# Patient Record
Sex: Male | Born: 1980 | Race: White | Hispanic: No | Marital: Single | Smoking: Current every day smoker
Health system: Southern US, Community
[De-identification: ages and names within clinical notes are randomized; demographics above are authoritative.]

---

## 2014-11-20 ENCOUNTER — Encounter (HOSPITAL_COMMUNITY)
Admission: EM | Discharge status: Against Medical Advice | Payer: Self-pay | Source: Home / Self Care | Attending: Emergency Medicine

## 2014-11-20 ENCOUNTER — Observation Stay (HOSPITAL_COMMUNITY): Payer: Self-pay | Admitting: Certified Registered Nurse Anesthetist

## 2014-11-20 ENCOUNTER — Observation Stay (HOSPITAL_COMMUNITY)
Admission: EM | Admit: 2014-11-20 | Discharge: 2014-11-20 | Discharge status: Against Medical Advice | Payer: Self-pay | Attending: Surgery | Admitting: Surgery

## 2014-11-20 ENCOUNTER — Emergency Department (HOSPITAL_COMMUNITY): Payer: Self-pay

## 2014-11-20 ENCOUNTER — Observation Stay (HOSPITAL_COMMUNITY): Payer: Self-pay

## 2014-11-20 ENCOUNTER — Encounter (HOSPITAL_COMMUNITY): Payer: Self-pay | Admitting: Emergency Medicine

## 2014-11-20 DIAGNOSIS — S62102A Fracture of unspecified carpal bone, left wrist, initial encounter for closed fracture: Secondary | ICD-10-CM | POA: Diagnosis present

## 2014-11-20 DIAGNOSIS — S3210XA Unspecified fracture of sacrum, initial encounter for closed fracture: Secondary | ICD-10-CM | POA: Insufficient documentation

## 2014-11-20 DIAGNOSIS — S329XXA Fracture of unspecified parts of lumbosacral spine and pelvis, initial encounter for closed fracture: Secondary | ICD-10-CM | POA: Insufficient documentation

## 2014-11-20 DIAGNOSIS — W19XXXA Unspecified fall, initial encounter: Secondary | ICD-10-CM

## 2014-11-20 DIAGNOSIS — S5292XA Unspecified fracture of left forearm, initial encounter for closed fracture: Secondary | ICD-10-CM | POA: Insufficient documentation

## 2014-11-20 DIAGNOSIS — T148XXA Other injury of unspecified body region, initial encounter: Secondary | ICD-10-CM

## 2014-11-20 DIAGNOSIS — S52612A Displaced fracture of left ulna styloid process, initial encounter for closed fracture: Principal | ICD-10-CM | POA: Insufficient documentation

## 2014-11-20 DIAGNOSIS — G5602 Carpal tunnel syndrome, left upper limb: Secondary | ICD-10-CM | POA: Insufficient documentation

## 2014-11-20 DIAGNOSIS — S32592A Other specified fracture of left pubis, initial encounter for closed fracture: Secondary | ICD-10-CM | POA: Insufficient documentation

## 2014-11-20 DIAGNOSIS — S3282XA Multiple fractures of pelvis without disruption of pelvic ring, initial encounter for closed fracture: Secondary | ICD-10-CM | POA: Diagnosis present

## 2014-11-20 DIAGNOSIS — F1210 Cannabis abuse, uncomplicated: Secondary | ICD-10-CM | POA: Insufficient documentation

## 2014-11-20 DIAGNOSIS — W14XXXA Fall from tree, initial encounter: Secondary | ICD-10-CM | POA: Insufficient documentation

## 2014-11-20 DIAGNOSIS — IMO0002 Reserved for concepts with insufficient information to code with codable children: Secondary | ICD-10-CM

## 2014-11-20 DIAGNOSIS — S22089A Unspecified fracture of T11-T12 vertebra, initial encounter for closed fracture: Secondary | ICD-10-CM | POA: Insufficient documentation

## 2014-11-20 DIAGNOSIS — S62122A Displaced fracture of lunate [semilunar], left wrist, initial encounter for closed fracture: Secondary | ICD-10-CM | POA: Insufficient documentation

## 2014-11-20 DIAGNOSIS — F17200 Nicotine dependence, unspecified, uncomplicated: Secondary | ICD-10-CM | POA: Insufficient documentation

## 2014-11-20 HISTORY — PX: CARPAL TUNNEL RELEASE: SHX101

## 2014-11-20 HISTORY — PX: ORIF WRIST FRACTURE: SHX2133

## 2014-11-20 LAB — COMPREHENSIVE METABOLIC PANEL
ALT: 26 U/L (ref 17–63)
AST: 50 U/L — AB (ref 15–41)
Albumin: 3.9 g/dL (ref 3.5–5.0)
Alkaline Phosphatase: 59 U/L (ref 38–126)
Anion gap: 11 (ref 5–15)
BILIRUBIN TOTAL: 0.6 mg/dL (ref 0.3–1.2)
BUN: 5 mg/dL — ABNORMAL LOW (ref 6–20)
CHLORIDE: 103 mmol/L (ref 101–111)
CO2: 23 mmol/L (ref 22–32)
Calcium: 8.8 mg/dL — ABNORMAL LOW (ref 8.9–10.3)
Creatinine, Ser: 1.33 mg/dL — ABNORMAL HIGH (ref 0.61–1.24)
GLUCOSE: 195 mg/dL — AB (ref 65–99)
POTASSIUM: 3.6 mmol/L (ref 3.5–5.1)
SODIUM: 137 mmol/L (ref 135–145)
Total Protein: 6.6 g/dL (ref 6.5–8.1)

## 2014-11-20 LAB — CBC
HEMATOCRIT: 48.1 % (ref 39.0–52.0)
Hemoglobin: 16.4 g/dL (ref 13.0–17.0)
MCH: 32.8 pg (ref 26.0–34.0)
MCHC: 34.1 g/dL (ref 30.0–36.0)
MCV: 96.2 fL (ref 78.0–100.0)
PLATELETS: 297 10*3/uL (ref 150–400)
RBC: 5 MIL/uL (ref 4.22–5.81)
RDW: 12.5 % (ref 11.5–15.5)
WBC: 9.4 10*3/uL (ref 4.0–10.5)

## 2014-11-20 LAB — I-STAT CHEM 8, ED
BUN: 6 mg/dL (ref 6–20)
CREATININE: 1.3 mg/dL — AB (ref 0.61–1.24)
Calcium, Ion: 1.18 mmol/L (ref 1.12–1.23)
Chloride: 102 mmol/L (ref 101–111)
GLUCOSE: 196 mg/dL — AB (ref 65–99)
HCT: 53 % — ABNORMAL HIGH (ref 39.0–52.0)
HEMOGLOBIN: 18 g/dL — AB (ref 13.0–17.0)
Potassium: 3.6 mmol/L (ref 3.5–5.1)
SODIUM: 140 mmol/L (ref 135–145)
TCO2: 24 mmol/L (ref 0–100)

## 2014-11-20 LAB — PROTIME-INR
INR: 1.09 (ref 0.00–1.49)
Prothrombin Time: 14.3 seconds (ref 11.6–15.2)

## 2014-11-20 LAB — ABO/RH: ABO/RH(D): O POS

## 2014-11-20 LAB — ETHANOL: Alcohol, Ethyl (B): 22 mg/dL — ABNORMAL HIGH (ref <5)

## 2014-11-20 LAB — TYPE AND SCREEN
ABO/RH(D): O POS
Antibody Screen: NEGATIVE

## 2014-11-20 SURGERY — OPEN REDUCTION INTERNAL FIXATION (ORIF) WRIST FRACTURE
Anesthesia: General | Site: Hand | Laterality: Left

## 2014-11-20 MED ORDER — STERILE WATER FOR INJECTION IJ SOLN
INTRAMUSCULAR | Status: AC
  Filled 2014-11-20: qty 10

## 2014-11-20 MED ORDER — MORPHINE SULFATE 2 MG/ML IJ SOLN
2.0000 mg | INTRAMUSCULAR | Status: DC | PRN
  Administered 2014-11-20: 2 mg via INTRAVENOUS
  Filled 2014-11-20: qty 1

## 2014-11-20 MED ORDER — ONDANSETRON HCL 4 MG PO TABS: 4.0000 mg | ORAL_TABLET | Freq: Four times a day (QID) | ORAL | Status: DC | PRN

## 2014-11-20 MED ORDER — ENOXAPARIN SODIUM 30 MG/0.3ML ~~LOC~~ SOLN: 30.0000 mg | Freq: Two times a day (BID) | SUBCUTANEOUS | Status: DC

## 2014-11-20 MED ORDER — OXYCODONE HCL 5 MG PO TABS
5.0000 mg | ORAL_TABLET | Freq: Once | ORAL | Status: DC | PRN
Start: 2014-11-20 — End: 2014-11-20

## 2014-11-20 MED ORDER — ARTIFICIAL TEARS OP OINT
TOPICAL_OINTMENT | OPHTHALMIC | Status: AC
  Filled 2014-11-20: qty 3.5

## 2014-11-20 MED ORDER — HYDROMORPHONE HCL 1 MG/ML IJ SOLN
1.0000 mg | Freq: Once | INTRAMUSCULAR | Status: AC
  Administered 2014-11-20: 1 mg via INTRAVENOUS
  Filled 2014-11-20: qty 1

## 2014-11-20 MED ORDER — ROCURONIUM BROMIDE 50 MG/5ML IV SOLN
INTRAVENOUS | Status: AC
  Filled 2014-11-20: qty 1

## 2014-11-20 MED ORDER — OXYCODONE HCL 5 MG/5ML PO SOLN: 5.0000 mg | Freq: Once | ORAL | Status: DC | PRN

## 2014-11-20 MED ORDER — METHOCARBAMOL 500 MG PO TABS: 500.0000 mg | ORAL_TABLET | Freq: Four times a day (QID) | ORAL | Status: DC | PRN

## 2014-11-20 MED ORDER — CEFAZOLIN SODIUM-DEXTROSE 2-3 GM-% IV SOLR
INTRAVENOUS | Status: AC
  Filled 2014-11-20: qty 50

## 2014-11-20 MED ORDER — ROCURONIUM BROMIDE 100 MG/10ML IV SOLN
INTRAVENOUS | Status: DC | PRN
  Administered 2014-11-20: 20 mg via INTRAVENOUS
  Administered 2014-11-20: 50 mg via INTRAVENOUS
  Administered 2014-11-20: 10 mg via INTRAVENOUS

## 2014-11-20 MED ORDER — DEXAMETHASONE SODIUM PHOSPHATE 4 MG/ML IJ SOLN
INTRAMUSCULAR | Status: DC | PRN
  Administered 2014-11-20: 4 mg via INTRAVENOUS

## 2014-11-20 MED ORDER — DEXAMETHASONE SODIUM PHOSPHATE 4 MG/ML IJ SOLN
INTRAMUSCULAR | Status: AC
  Filled 2014-11-20: qty 1

## 2014-11-20 MED ORDER — POLYETHYLENE GLYCOL 3350 17 G PO PACK: 17.0000 g | PACK | Freq: Every day | ORAL | Status: DC

## 2014-11-20 MED ORDER — MORPHINE SULFATE 4 MG/ML IJ SOLN
4.0000 mg | Freq: Once | INTRAMUSCULAR | Status: AC
  Administered 2014-11-20: 4 mg via INTRAVENOUS
  Filled 2014-11-20: qty 1

## 2014-11-20 MED ORDER — FENTANYL CITRATE (PF) 100 MCG/2ML IJ SOLN: 25.0000 ug | INTRAMUSCULAR | Status: DC | PRN

## 2014-11-20 MED ORDER — NEOSTIGMINE METHYLSULFATE 10 MG/10ML IV SOLN
INTRAVENOUS | Status: AC
  Filled 2014-11-20: qty 3

## 2014-11-20 MED ORDER — MIDAZOLAM HCL 2 MG/2ML IJ SOLN
INTRAMUSCULAR | Status: AC
  Administered 2014-11-20: 2 mg via INTRAVENOUS
  Filled 2014-11-20: qty 2

## 2014-11-20 MED ORDER — SUCCINYLCHOLINE CHLORIDE 20 MG/ML IJ SOLN
INTRAMUSCULAR | Status: DC | PRN
  Administered 2014-11-20: 100 mg via INTRAVENOUS

## 2014-11-20 MED ORDER — 0.9 % SODIUM CHLORIDE (POUR BTL) OPTIME
TOPICAL | Status: DC | PRN
  Administered 2014-11-20: 1000 mL

## 2014-11-20 MED ORDER — GLYCOPYRROLATE 0.2 MG/ML IJ SOLN
INTRAMUSCULAR | Status: DC | PRN
  Administered 2014-11-20: .6 mg via INTRAVENOUS

## 2014-11-20 MED ORDER — ONDANSETRON HCL 4 MG/2ML IJ SOLN
4.0000 mg | Freq: Four times a day (QID) | INTRAMUSCULAR | Status: DC | PRN
  Administered 2014-11-20: 4 mg via INTRAVENOUS

## 2014-11-20 MED ORDER — IOHEXOL 300 MG/ML  SOLN
100.0000 mL | Freq: Once | INTRAMUSCULAR | Status: AC | PRN
Start: 2014-11-20 — End: 2014-11-20
  Administered 2014-11-20: 100 mL via INTRAVENOUS

## 2014-11-20 MED ORDER — OXYCODONE HCL 5 MG PO TABS: 10.0000 mg | ORAL_TABLET | ORAL | Status: DC | PRN

## 2014-11-20 MED ORDER — MIDAZOLAM HCL 2 MG/2ML IJ SOLN
INTRAMUSCULAR | Status: AC
  Filled 2014-11-20: qty 2

## 2014-11-20 MED ORDER — NEOSTIGMINE METHYLSULFATE 10 MG/10ML IV SOLN
INTRAVENOUS | Status: DC | PRN
  Administered 2014-11-20: 4 mg via INTRAVENOUS

## 2014-11-20 MED ORDER — CEFAZOLIN SODIUM 1-5 GM-% IV SOLN
1.0000 g | Freq: Three times a day (TID) | INTRAVENOUS | Status: DC
  Filled 2014-11-20: qty 50

## 2014-11-20 MED ORDER — GLYCOPYRROLATE 0.2 MG/ML IJ SOLN
INTRAMUSCULAR | Status: AC
  Filled 2014-11-20: qty 3

## 2014-11-20 MED ORDER — MIDAZOLAM HCL 2 MG/2ML IJ SOLN
2.0000 mg | Freq: Once | INTRAMUSCULAR | Status: AC
  Administered 2014-11-20: 2 mg via INTRAVENOUS

## 2014-11-20 MED ORDER — ONDANSETRON HCL 4 MG/2ML IJ SOLN: 4.0000 mg | Freq: Once | INTRAMUSCULAR | Status: DC | PRN

## 2014-11-20 MED ORDER — NEOSTIGMINE METHYLSULFATE 10 MG/10ML IV SOLN
INTRAVENOUS | Status: AC
  Filled 2014-11-20: qty 1

## 2014-11-20 MED ORDER — VITAMIN C 500 MG PO TABS: 1000.0000 mg | ORAL_TABLET | Freq: Every day | ORAL | Status: DC

## 2014-11-20 MED ORDER — OXYCODONE HCL 5 MG PO TABS
5.0000 mg | ORAL_TABLET | ORAL | Status: DC | PRN
  Administered 2014-11-20: 10 mg via ORAL
  Filled 2014-11-20: qty 2

## 2014-11-20 MED ORDER — FENTANYL CITRATE (PF) 250 MCG/5ML IJ SOLN
INTRAMUSCULAR | Status: AC
Start: 2014-11-20 — End: 2014-11-20
  Filled 2014-11-20: qty 5

## 2014-11-20 MED ORDER — DOCUSATE SODIUM 100 MG PO CAPS: 100.0000 mg | ORAL_CAPSULE | Freq: Two times a day (BID) | ORAL | Status: DC

## 2014-11-20 MED ORDER — LACTATED RINGERS IV SOLN
INTRAVENOUS | Status: DC
  Administered 2014-11-20 (×2): via INTRAVENOUS

## 2014-11-20 MED ORDER — LIDOCAINE HCL (CARDIAC) 20 MG/ML IV SOLN
INTRAVENOUS | Status: DC | PRN
  Administered 2014-11-20: 80 mg via INTRAVENOUS

## 2014-11-20 MED ORDER — FENTANYL CITRATE (PF) 100 MCG/2ML IJ SOLN
INTRAMUSCULAR | Status: DC | PRN
  Administered 2014-11-20: 50 ug via INTRAVENOUS

## 2014-11-20 MED ORDER — FENTANYL CITRATE (PF) 100 MCG/2ML IJ SOLN
INTRAMUSCULAR | Status: AC
  Administered 2014-11-20: 100 ug via INTRAVENOUS
  Filled 2014-11-20: qty 2

## 2014-11-20 MED ORDER — PROPOFOL 10 MG/ML IV BOLUS
INTRAVENOUS | Status: DC | PRN
  Administered 2014-11-20: 400 mg via INTRAVENOUS

## 2014-11-20 MED ORDER — ONDANSETRON HCL 4 MG/2ML IJ SOLN
INTRAMUSCULAR | Status: AC
  Filled 2014-11-20: qty 2

## 2014-11-20 MED ORDER — SODIUM CHLORIDE 0.9 % IV SOLN
1000.0000 mL | Freq: Once | INTRAVENOUS | Status: AC
Start: 2014-11-20 — End: 2014-11-20
  Administered 2014-11-20: 1000 mL via INTRAVENOUS

## 2014-11-20 MED ORDER — FENTANYL CITRATE (PF) 100 MCG/2ML IJ SOLN
100.0000 ug | Freq: Once | INTRAMUSCULAR | Status: AC
  Administered 2014-11-20: 100 ug via INTRAVENOUS

## 2014-11-20 MED ORDER — CEFAZOLIN SODIUM-DEXTROSE 2-3 GM-% IV SOLR
INTRAVENOUS | Status: DC | PRN
Start: 2014-11-20 — End: 2014-11-20
  Administered 2014-11-20: 2 g via INTRAVENOUS

## 2014-11-20 MED ORDER — SODIUM CHLORIDE 0.9 % IV SOLN
1000.0000 mL | INTRAVENOUS | Status: DC
  Administered 2014-11-20: 1000 mL via INTRAVENOUS

## 2014-11-20 SURGICAL SUPPLY — 59 items
ANCHOR FT CORKSCREW MICRO 2-0 (Anchor) ×6 IMPLANT
BANDAGE ELASTIC 3 VELCRO ST LF (GAUZE/BANDAGES/DRESSINGS) ×3 IMPLANT
BANDAGE ELASTIC 4 VELCRO ST LF (GAUZE/BANDAGES/DRESSINGS) ×3 IMPLANT
BLADE SURG ROTATE 9660 (MISCELLANEOUS) IMPLANT
BNDG ESMARK 4X9 LF (GAUZE/BANDAGES/DRESSINGS) ×3 IMPLANT
BNDG GAUZE ELAST 4 BULKY (GAUZE/BANDAGES/DRESSINGS) ×3 IMPLANT
CANISTER SUCTION WELLS/JOHNSON (MISCELLANEOUS) ×3 IMPLANT
CORDS BIPOLAR (ELECTRODE) ×6 IMPLANT
COVER SURGICAL LIGHT HANDLE (MISCELLANEOUS) ×3 IMPLANT
CUFF TOURNIQUET SINGLE 18IN (TOURNIQUET CUFF) ×3 IMPLANT
CUFF TOURNIQUET SINGLE 24IN (TOURNIQUET CUFF) IMPLANT
DRAIN TLS ROUND 10FR (DRAIN) IMPLANT
DRAPE OEC MINIVIEW 54X84 (DRAPES) ×3 IMPLANT
DRAPE SURG 17X23 STRL (DRAPES) ×3 IMPLANT
DRSG ADAPTIC 3X8 NADH LF (GAUZE/BANDAGES/DRESSINGS) ×3 IMPLANT
GAUZE SPONGE 4X4 12PLY STRL (GAUZE/BANDAGES/DRESSINGS) ×3 IMPLANT
GAUZE XEROFORM 1X8 LF (GAUZE/BANDAGES/DRESSINGS) IMPLANT
GAUZE XEROFORM 5X9 LF (GAUZE/BANDAGES/DRESSINGS) ×3 IMPLANT
GLOVE BIOGEL M STRL SZ7.5 (GLOVE) ×3 IMPLANT
GLOVE BIOGEL PI IND STRL 6.5 (GLOVE) ×1 IMPLANT
GLOVE BIOGEL PI IND STRL 7.0 (GLOVE) ×1 IMPLANT
GLOVE BIOGEL PI INDICATOR 6.5 (GLOVE) ×2
GLOVE BIOGEL PI INDICATOR 7.0 (GLOVE) ×2
GLOVE SS BIOGEL STRL SZ 8 (GLOVE) ×2 IMPLANT
GLOVE SUPERSENSE BIOGEL SZ 8 (GLOVE) ×4
GOWN STRL REUS W/ TWL LRG LVL3 (GOWN DISPOSABLE) ×1 IMPLANT
GOWN STRL REUS W/ TWL XL LVL3 (GOWN DISPOSABLE) ×2 IMPLANT
GOWN STRL REUS W/TWL LRG LVL3 (GOWN DISPOSABLE) ×2
GOWN STRL REUS W/TWL XL LVL3 (GOWN DISPOSABLE) ×4
IV CATH 14GX2 1/4 (CATHETERS) ×3 IMPLANT
K-WIRE DBL TROCAR .045X4 ×21 IMPLANT
K-WIRE DBL TROCAR .062X4 ×6 IMPLANT
KIT BASIN OR (CUSTOM PROCEDURE TRAY) ×3 IMPLANT
KIT ROOM TURNOVER OR (KITS) ×3 IMPLANT
KWIRE DBL TROCAR .045X4 ×7 IMPLANT
KWIRE DBL TROCAR .062X4 ×2 IMPLANT
LOOP VESSEL MAXI BLUE (MISCELLANEOUS) IMPLANT
MANIFOLD NEPTUNE II (INSTRUMENTS) IMPLANT
NEEDLE 18GX1X1/2 (RX/OR ONLY) (NEEDLE) ×3 IMPLANT
NEEDLE 22X1 1/2 (OR ONLY) (NEEDLE) IMPLANT
NS IRRIG 1000ML POUR BTL (IV SOLUTION) ×3 IMPLANT
PACK ORTHO EXTREMITY (CUSTOM PROCEDURE TRAY) ×3 IMPLANT
PAD ARMBOARD 7.5X6 YLW CONV (MISCELLANEOUS) ×6 IMPLANT
PAD CAST 3X4 CTTN HI CHSV (CAST SUPPLIES) IMPLANT
PAD CAST 4YDX4 CTTN HI CHSV (CAST SUPPLIES) IMPLANT
PADDING CAST COTTON 3X4 STRL (CAST SUPPLIES)
PADDING CAST COTTON 4X4 STRL (CAST SUPPLIES)
SPONGE LAP 4X18 X RAY DECT (DISPOSABLE) IMPLANT
SUT MNCRL AB 4-0 PS2 18 (SUTURE) IMPLANT
SUT PROLENE 3 0 PS 2 (SUTURE) IMPLANT
SUT PROLENE 4 0 PS 2 18 (SUTURE) ×6 IMPLANT
SUT VIC AB 3-0 FS2 27 (SUTURE) ×3 IMPLANT
SYR CONTROL 10ML LL (SYRINGE) IMPLANT
SYSTEM CHEST DRAIN TLS 7FR (DRAIN) ×6 IMPLANT
TOWEL OR 17X24 6PK STRL BLUE (TOWEL DISPOSABLE) ×3 IMPLANT
TOWEL OR 17X26 10 PK STRL BLUE (TOWEL DISPOSABLE) ×3 IMPLANT
TUBE CONNECTING 12'X1/4 (SUCTIONS) ×1
TUBE CONNECTING 12X1/4 (SUCTIONS) ×2 IMPLANT
TUBE EVACUATION TLS (MISCELLANEOUS) IMPLANT

## 2014-11-20 NOTE — Progress Notes (Signed)
   11/20/14 0900  Clinical Encounter Type  Visited With Health care provider  Visit Type Initial;ED;Trauma   Chaplain was paged to the ED for a level 2 trauma at 9:18 AM. Patient sustained injury after a fall from a tree. Medical team was working with patient when chaplain arrived. Patient requested for his wife to be contacted. However, she has called his personal cell phone and now knows he is at the hospital. No further support needs appear needed at this time. Page Merrilyn Puman-Call chaplain if further support needed.  Cranston NeighborStrother, Breonia Kirstein R, Chaplain  9:39 AM

## 2014-11-20 NOTE — Progress Notes (Signed)
Report given to [place], Scientist, clinical (histocompatibility and immunogenetics)CRNA

## 2014-11-20 NOTE — Consult Note (Signed)
Reason for Consult left wrist fracture dislocation Referring Physician: Trauma service  [name] is an 34 y.o. male.  HPI: 34 year old status post fall 20 feet from a tree as he was attempting to cut down the tree. He sustained a perilunate fracture dislocation with acute carpal tunnel syndrome and likely median nerve injury  He also complains of pelvic pain which general orthopedics is attending to-Dr. Percell Miller  He denies abdominal pain chest pain or neck pain at present time. I reviewed his findings and his complaints. He complains that his hand is numb and that his wrist is highly tender due to the fracture dislocation. X-ray and CT scan of been reviewed.  I reviewed his chart at great length. He is been admitted by the trauma service  History reviewed. No pertinent past medical history.  History reviewed. No pertinent past surgical history.  No family history on file.  Social History:  reports that he has been smoking.  He does not have any smokeless tobacco history on file. He reports that he drinks alcohol. He reports that he uses illicit drugs (Marijuana).  Allergies: No Known Allergies  Medications: I have reviewed the patient's current medications.  Results for orders placed or performed during the hospital encounter of 11/20/14 (from the past 48 hour(s))  Comprehensive metabolic panel     Status: Abnormal   Collection Time: 11/20/14  9:20 AM  Result Value Ref Range   Sodium 137 135 - 145 mmol/L   Potassium 3.6 3.5 - 5.1 mmol/L   Chloride 103 101 - 111 mmol/L   CO2 23 22 - 32 mmol/L   Glucose, Bld 195 (H) 65 - 99 mg/dL   BUN 5 (L) 6 - 20 mg/dL   Creatinine, Ser 1.33 (H) 0.61 - 1.24 mg/dL   Calcium 8.8 (L) 8.9 - 10.3 mg/dL   Total Protein 6.6 6.5 - 8.1 g/dL   Albumin 3.9 3.5 - 5.0 g/dL   AST 50 (H) 15 - 41 U/L   ALT 26 17 - 63 U/L   Alkaline Phosphatase 59 38 - 126 U/L   Total Bilirubin 0.6 0.3 - 1.2 mg/dL   GFR calc non Af Amer >60 >60 mL/min   GFR calc Af Amer >60  >60 mL/min    Comment: (NOTE) The eGFR has been calculated using the CKD EPI equation. This calculation has not been validated in all clinical situations. eGFR's persistently <60 mL/min signify possible Chronic Kidney Disease.    Anion gap 11 5 - 15  CBC     Status: None   Collection Time: 11/20/14  9:20 AM  Result Value Ref Range   WBC 9.4 4.0 - 10.5 K/uL   RBC 5.00 4.22 - 5.81 MIL/uL   Hemoglobin 16.4 13.0 - 17.0 g/dL   HCT 48.1 39.0 - 52.0 %   MCV 96.2 78.0 - 100.0 fL   MCH 32.8 26.0 - 34.0 pg   MCHC 34.1 30.0 - 36.0 g/dL   RDW 12.5 11.5 - 15.5 %   Platelets 297 150 - 400 K/uL  Ethanol     Status: Abnormal   Collection Time: 11/20/14  9:20 AM  Result Value Ref Range   Alcohol, Ethyl (B) 22 (H) <5 mg/dL    Comment:        LOWEST DETECTABLE LIMIT FOR SERUM ALCOHOL IS 5 mg/dL FOR MEDICAL PURPOSES ONLY   Protime-INR     Status: None   Collection Time: 11/20/14  9:20 AM  Result Value Ref Range   Prothrombin  Time 14.3 11.6 - 15.2 seconds   INR 1.09 0.00 - 1.49  Type and screen     Status: None   Collection Time: 11/20/14  9:20 AM  Result Value Ref Range   ABO/RH(D) O POS    Antibody Screen NEG    Sample Expiration 11/23/2014   ABO/Rh     Status: None   Collection Time: 11/20/14  9:20 AM  Result Value Ref Range   ABO/RH(D) O POS   I-Stat Chem 8, ED  (not at [facility], [facility])     Status: Abnormal   Collection Time: 11/20/14  9:45 AM  Result Value Ref Range   Sodium 140 135 - 145 mmol/L   Potassium 3.6 3.5 - 5.1 mmol/L   Chloride 102 101 - 111 mmol/L   BUN 6 6 - 20 mg/dL   Creatinine, Ser 1.30 (H) 0.61 - 1.24 mg/dL   Glucose, Bld 196 (H) 65 - 99 mg/dL   Calcium, Ion 1.18 1.12 - 1.23 mmol/L   TCO2 24 0 - 100 mmol/L   Hemoglobin 18.0 (H) 13.0 - 17.0 g/dL   HCT 53.0 (H) 39.0 - 52.0 %    Dg Wrist Complete Left  11/20/2014   ADDENDUM REPORT: 11/20/2014 11:07  ADDENDUM: These results were called by telephone at the time of interpretation on 11/20/2014 at 1059 am to Dr. Veryl Speak , who verbally acknowledged these results.   Electronically Signed   By: Lowella Grip III M.D.   On: 11/20/2014 11:07   11/20/2014   CLINICAL DATA:  Patient fell from tree  EXAM: LEFT WRIST - COMPLETE 3+ VIEW  COMPARISON:  None.  FINDINGS: Frontal, oblique, lateral, and ulnar deviation scaphoid images were obtained. There is a comminuted fracture of the ulnar styloid with mild displacement of the ulnar styloid from the remainder of the ulna. There is widening between the scaphoid and lunate bone indicative of scapholunate disassociation. On the lateral view, the triquetrum and capitate appear posterior with respect to the lunate, a finding concerning for perilunate dislocation. There is a sclerotic area in the distal scaphoid, concerning for an impaction type injury in this area.  There is evidence of an old healed fracture of the distal fifth metacarpal.  IMPRESSION: Findings concerning for perilunate dislocation. There is scapholunate disassociation with findings concerning for impaction type injury in the distal scaphoid. CT could be helpful for further evaluation of carpal alignment as well as further evaluation of the scaphoid bone in particular.  There is a comminuted fracture of the ulnar styloid with mild displacement of the ulnar styloid with respect to the remainder the ulna. There is evidence of an old healed fracture of the fifth metacarpal.  Electronically Signed: By: Lowella Grip III M.D. On: 11/20/2014 10:58   Ct Lumbar Spine Wo Contrast  11/20/2014   CLINICAL DATA:  Status post 16-20 foot fall today while cutting a tree. Low back pain. Initial encounter.  EXAM: CT LUMBAR SPINE WITHOUT CONTRAST LIMITED  TECHNIQUE: Multidetector CT imaging of the lumbar spine was performed without intravenous contrast administration. Multiplanar CT image reconstructions were also generated.  COMPARISON:  CT abdomen and pelvis this same day.  FINDINGS: The patient has a mild anterior, superior endplate  compression fracture of T12 with an associated subtle sclerotic line at the fracture site. Vertebral body height loss is estimated at up to 15%. There is also mild vertebral body height loss anteriorly at T11 without a sclerotic line identified. This could be due to acute or  remote fracture or less likely Schmorl's nodes. There is no bony retropulsion at either T11 or T12. No involvement of posterior elements is identified. Except as noted, vertebral body height is maintained. Left sacral fracture is noted as seen on CT abdomen and pelvis this same day.  Schmorl's nodes are seen in the inferior endplates of M78, M75 and T12.  IMPRESSION: Mild T12 superior endplate compression fracture without bony retropulsion or extension into the posterior elements.  Mild anterior wedging of T11 may be due to acute or subacute fracture or possibly Schmorl's nodes although this is thought less likely. There is no bony retropulsion or extension into the posterior elements.  Left sacral fracture.   Electronically Signed   By: Inge Rise M.D.   On: 11/20/2014 11:09   Ct Abdomen Pelvis W Contrast  11/20/2014   CLINICAL DATA:  Status post 16-20 foot fall cutting a tree today. Left side low back pain. Pelvic and left groin pain. Initial encounter.  EXAM: CT ABDOMEN AND PELVIS WITH CONTRAST  TECHNIQUE: Multidetector CT imaging of the abdomen and pelvis was performed using the standard protocol following bolus administration of intravenous contrast.  CONTRAST:  100 mL OMNIPAQUE IOHEXOL 300 MG/ML  SOLN  COMPARISON:  None.  FINDINGS: Dependent atelectasis is seen in the lung bases. No pneumothorax is identified. There is no pleural or pericardial effusion.  The gallbladder, spleen, adrenal glands, pancreas and kidneys are unremarkable. Fatty infiltration liver along the falciform ligament is noted. The liver is otherwise unremarkable. No lymphadenopathy is identified.  The patient has a nondisplaced left sacral fracture. Also seen is  a nondisplaced fracture of the high left superior pubic ramus. Mildly displaced fracture of the left inferior pubic ramus is noted. Hematoma is seen anterior to the sacrum and in the left side of the pelvis related to the patient's fractures. There is a mild superior endplate compression fracture of T12. Mild anterior wedging of T11 is also noted but may be related to Schmorl's nodes. No other fracture is identified.  IMPRESSION: Acute left sacral and pubic rami fractures.  Mild superior endplate compression fracture of T12. Please see report of dedicated lumbar spine CT scan this same day.  Mild anterior wedging of T11 could be due to fracture or related to Schmorl's nodes. No other acute finding is identified.   Electronically Signed   By: Inge Rise M.D.   On: 11/20/2014 11:01   Ct Wrist Left Wo Contrast  11/20/2014   CLINICAL DATA:  Fall.  Wrist pain.  Initial encounter.  EXAM: CT OF THE LEFT WRIST WITHOUT CONTRAST  TECHNIQUE: Multidetector CT imaging was performed according to the standard protocol. Multiplanar CT image reconstructions were also generated.  COMPARISON:  11/20/2014 wrist radiographs.  FINDINGS: Perilunate dislocation is present. The radiolunate articulation is preserved. There is dorsal dislocation of the scaphoid, capitate and triquetrum bones. Associated fractures of the dorsal lip of the radius. Comminuted minimally displaced ulnar styloid fracture is also present. Scaphoid waist appears intact. Carpometacarpal joints are normal.  IMPRESSION: Perilunate dislocation. Comminuted ulnar styloid fracture and small fracture fragments from the dorsal lip of the radius. Significantly, the scaphoid bone appears intact.   Electronically Signed   By: Dereck Ligas M.D.   On: 11/20/2014 12:50   Dg Pelvis Portable  11/20/2014   CLINICAL DATA:  Pain following fall from tree  EXAM: PORTABLE PELVIS 1-2 VIEWS  COMPARISON:  None.  FINDINGS: There are small calcifications inferior to the medial left  ischium. Suspect small  avulsions in this area. There is a subtle lucency in the medial aspect of the left femoral neck. A subtle fracture in this area cannot be excluded. No other findings suggesting potential fracture. No dislocation. Joint spaces appear intact. There is a bone island in the left femoral neck region peer  IMPRESSION: Suspect small avulsions arising from the medial inferior left ischium. There is a subtle lucency in the medial aspect of the left femoral neck. Dedicated left hip images are advised to further evaluate this finding. Based on this single image, a nondisplaced fracture in this area cannot be excluded.  Study otherwise unremarkable except for a small bone island in the right femoral neck.   Electronically Signed   By: Lowella Grip III M.D.   On: 11/20/2014 09:58   Dg Pelvis Comp Min 3v  11/20/2014   CLINICAL DATA:  Followup pelvic fractures noted on the current CT. Left-sided pelvic pain. Patient fell from a tree today.  EXAM: JUDET PELVIS - 3+ VIEW  COMPARISON:  Current abdomen and pelvis CT.  FINDINGS: There are fractures of the inferior left ischium near the ischial pelvic junction, and of the superior medial left pubis, without significant displacement or comminution. The left sacral fracture is not well defined radiographically.  No other fractures. Hip joints, SI joints and symphysis pubis are normally spaced and aligned.  Residual contrast is noted in an intact bladder.  IMPRESSION: 1. Nondisplaced fractures of the left pubis in the inferior left ischium as noted on the current CT. Left sacral fracture not defined radiographically. 2. No other fractures.  No dislocation.  Intact bladder.   Electronically Signed   By: Lajean Manes M.D.   On: 11/20/2014 14:43   Dg Chest Portable 1 View  11/20/2014   CLINICAL DATA:  Status post a fall from tree; patient reports shoulder pain.  EXAM: PORTABLE CHEST - 1 VIEW  COMPARISON:  None.  FINDINGS: The lungs are adequately inflated.  There is no pneumothorax or pneumomediastinum. The heart and pulmonary vascularity are normal. The mediastinum is normal in width. The observed portions of the bony thorax are unremarkable. The shoulders are not completely included in the field of view.  IMPRESSION: No acute post traumatic injury of the thorax is observed. There is no acute cardiopulmonary disease.   Electronically Signed   By: David  Martinique M.D.   On: 11/20/2014 09:52   Dg Shoulder Left  11/20/2014   CLINICAL DATA:  Status post 16-20 foot fall from a tree today. Left shoulder pain and stiffness. Initial encounter.  EXAM: LEFT SHOULDER - 2+ VIEW  COMPARISON:  None.  FINDINGS: There is no evidence of fracture or dislocation. There is no evidence of arthropathy or other focal bone abnormality. Soft tissues are unremarkable.  IMPRESSION: Negative exam.   Electronically Signed   By: Inge Rise M.D.   On: 11/20/2014 11:10    Review of Systems  Respiratory: Negative.   Genitourinary: Negative.    Blood pressure 141/86, pulse 69, temperature 98.2 F (36.8 C), temperature source Oral, resp. rate 18, height '6\' 3"'  (1.905 m), weight 104.327 kg (230 lb), SpO2 100 %. Physical Exam  male , alert and oriented Left wrist has obvious deformity swelling and numbness in the carpal tunnel distribution. This is a closed injury.  Bilateral elbow examination and shoulder examination is benign is no evidence of compartment syndrome. There is no evidence of instability about the elbow or shoulder. He has intact pulse. He has intact refill to the  fingers bilaterally. His right upper extremity has IV access.  His x-rays of left wrist do show perilunate fracture dislocation.  His pelvis is tender he has fractures attended to by general orthopedics present at present time lower extremity exam is intact to sensation and motor function. Abdomen is not particularly tender his chest is clear.  He is alert and oriented and very  appropriate.  Assessment/Plan: Left wrist perilunate fracture dislocation with associated carpal tunnel syndrome.  I would recommend immediate surgical repair reconstruction with carpal tunnel release. I discussed with him risk of infection bleeding anesthesia stiffness and long-term outlook with this injury which is rather severe in regards to the ultimate disability imparted  We'll proceed accordingly assess operative facility is available  The patient is alert and oriented in no acute distress. The patient complains of pain in the affected upper extremity.  The patient is noted to have a normal HEENT exam. Lung fields show equal chest expansion and no shortness of breath. Abdomen exam is nontender without distention. Lower extremity examination does not show any fracture dislocation or blood clot symptoms. Pelvis is stable and the neck and back are stable and nontender.  Paulene Floor 11/20/2014, 5:07 PM

## 2014-11-20 NOTE — ED Notes (Signed)
Attempted report 

## 2014-11-20 NOTE — ED Notes (Signed)
Notified OR of pts room assignment

## 2014-11-20 NOTE — Anesthesia Procedure Notes (Addendum)
Anesthesia Regional Block:  Supraclavicular block  Pre-Anesthetic Checklist: ,, timeout performed, Correct Patient, Correct Site, Correct Laterality, Correct Procedure, Correct Position, site marked, Risks and benefits discussed,  Surgical consent,  Pre-op evaluation,  At surgeon's request and post-op pain management  Laterality: Left  Prep: chloraprep       Needles:  Injection technique: Single-shot     Needle Length: 9cm 9 cm Needle Gauge: 22 and 22 G    Additional Needles:  Procedures: ultrasound guided (picture in chart) Supraclavicular block Narrative:  Start time: 11/20/2014 5:45 PM End time: 11/20/2014 5:55 PM Injection made incrementally with aspirations every 5 mL.  Performed by: Personally   Additional Notes: 30 cc 0.5% Bupivacaine with 1:200 Epi injected easily   Procedure Name: Intubation Date/Time: 11/20/2014 6:39 PM Performed by: Merdis Delay Pre-anesthesia Checklist: Patient identified, Emergency Drugs available, Suction available, Patient being monitored and Timeout performed Patient Re-evaluated:Patient Re-evaluated prior to inductionOxygen Delivery Method: Circle system utilized Preoxygenation: Pre-oxygenation with 100% oxygen Intubation Type: IV induction Laryngoscope Size: Mac and 4 Grade View: Grade II Tube type: Oral Tube size: 7.5 mm Number of attempts: 1 Airway Equipment and Method: Stylet and LTA kit utilized Placement Confirmation: ETT inserted through vocal cords under direct vision,  positive ETCO2,  CO2 detector and breath sounds checked- equal and bilateral Secured at: 24 cm Tube secured with: Tape Dental Injury: Teeth and Oropharynx as per pre-operative assessment

## 2014-11-20 NOTE — Anesthesia Postprocedure Evaluation (Signed)
  Anesthesia Post-op Note  Patient: [name] Ihnen  Procedure(s) Performed: Procedure(s): OPEN REDUCTION INTERNAL FIXATION (ORIF) LEFT WRIST RECONSTRUCTION  (Left) CARPAL TUNNEL RELEASE (Left)  Patient Location: PACU  Anesthesia Type: General, regional   Level of Consciousness: awake, alert  and oriented  Airway and Oxygen Therapy: Patient Spontanous Breathing  Post-op Pain: none  Post-op Assessment: Post-op Vital signs reviewed  Post-op Vital Signs: Reviewed  Last Vitals:  Filed Vitals:   11/20/14 2100  BP: 146/92  Pulse: 59  Temp: 36.5 C  Resp: 17    Complications: No apparent anesthesia complications

## 2014-11-20 NOTE — Progress Notes (Addendum)
Patient left the floor to OR

## 2014-11-20 NOTE — Anesthesia Preprocedure Evaluation (Addendum)
Anesthesia Evaluation  Patient identified by MRN, date of birth, ID band Patient awake    Reviewed: Allergy & Precautions, NPO status , Patient's Chart, lab work & pertinent test results  Airway Mallampati: II  TM Distance: >3 FB Neck ROM: Full    Dental  (+) Poor Dentition,    Pulmonary pneumonia -, Current Smoker,  breath sounds clear to auscultation        Cardiovascular Rhythm:Regular Rate:Normal     Neuro/Psych    GI/Hepatic   Endo/Other    Renal/GU      Musculoskeletal   Abdominal   Peds  Hematology   Anesthesia Other Findings   Reproductive/Obstetrics                           Anesthesia Physical Anesthesia Plan  ASA: II  Anesthesia Plan: General   Post-op Pain Management:    Induction: Intravenous  Airway Management Planned: Oral ETT  Additional Equipment:   Intra-op Plan:   Post-operative Plan: Extubation in OR  Informed Consent: I have reviewed the patients History and Physical, chart, labs and discussed the procedure including the risks, benefits and alternatives for the proposed anesthesia with the patient or authorized representative who has indicated his/her understanding and acceptance.   Dental advisory given  Plan Discussed with: CRNA and Anesthesiologist  Anesthesia Plan Comments:         Anesthesia Quick Evaluation

## 2014-11-20 NOTE — ED Notes (Signed)
Pt reports that he was working on a cutting a tree and fell approximately 16-20 feet. Pt reports left groin pain and left wrist pain. Pt alert x4.

## 2014-11-20 NOTE — ED Notes (Signed)
This RN came back in room and wife had sat pt up in the stretcher and informed them both that he had not been cleared from the CT scans and the doctor. Pt lowered back down. This RN asked prior who sat him up and the wife responded that she did.

## 2014-11-20 NOTE — Transfer of Care (Signed)
Immediate Anesthesia Transfer of Care Note  Patient: Kyle SalisburyWade Wedge  Procedure(s) Performed: Procedure(s): OPEN REDUCTION INTERNAL FIXATION (ORIF) LEFT WRIST RECONSTRUCTION  (Left) CARPAL TUNNEL RELEASE (Left)  Patient Location: PACU  Anesthesia Type:General  Level of Consciousness: awake, alert  and patient cooperative  Airway & Oxygen Therapy: Patient Spontanous Breathing and Patient connected to nasal cannula oxygen  Post-op Assessment: Report given to RN and Post -op Vital signs reviewed and stable  Post vital signs: Reviewed and stable  Last Vitals:  Filed Vitals:   11/20/14 2042  BP:   Pulse: 71  Temp: 36.8 C  Resp: 11    Complications: No apparent anesthesia complications

## 2014-11-20 NOTE — ED Notes (Signed)
No neuro arrival.

## 2014-11-20 NOTE — Consult Note (Signed)
ORTHOPAEDIC CONSULTATION  REQUESTING PHYSICIAN: Veryl Speak, MD  Chief Complaint: fall from a tree  HPI: [name] is a 34 y.o. male who complains of  he was hoping to cut down a tree and was anchored roughly 20 feet up in the air when the top of the tree broke its anchor line and pulled him down. He landed on his left legs then fell to his left side. He complains of pain in his left wrist as well as pain in his left groin no other complaints. He reports numbness and tingling in his left hand otherwise he denies any changes in sensation.  History reviewed. No pertinent past medical history. History reviewed. No pertinent past surgical history. History   Social History  . Marital Status: Single    Spouse Name: N/A  . Number of Children: N/A  . Years of Education: N/A   Social History Main Topics  . Smoking status: Current Every Day Smoker  . Smokeless tobacco: Not on file  . Alcohol Use: Yes  . Drug Use: Yes    Special: Marijuana  . Sexual Activity: Not on file   Other Topics Concern  . None   Social History Narrative  . None   No family history on file. No Known Allergies Prior to Admission medications   Medication Sig Start Date End Date Taking? Authorizing Provider  multivitamin (ONE-A-DAY MEN'S) TABS tablet Take 1 tablet by mouth daily.   Yes Historical Provider, MD  Omega-3 Fatty Acids (FISH OIL PO) Take 1 capsule by mouth daily.   Yes Historical Provider, MD   Dg Wrist Complete Left  11/20/2014   ADDENDUM REPORT: 11/20/2014 11:07  ADDENDUM: These results were called by telephone at the time of interpretation on 11/20/2014 at 1059 am to Dr. Veryl Speak , who verbally acknowledged these results.   Electronically Signed   By: Lowella Grip III M.D.   On: 11/20/2014 11:07   11/20/2014   CLINICAL DATA:  Patient fell from tree  EXAM: LEFT WRIST - COMPLETE 3+ VIEW  COMPARISON:  None.  FINDINGS: Frontal, oblique, lateral, and ulnar deviation scaphoid images were  obtained. There is a comminuted fracture of the ulnar styloid with mild displacement of the ulnar styloid from the remainder of the ulna. There is widening between the scaphoid and lunate bone indicative of scapholunate disassociation. On the lateral view, the triquetrum and capitate appear posterior with respect to the lunate, a finding concerning for perilunate dislocation. There is a sclerotic area in the distal scaphoid, concerning for an impaction type injury in this area.  There is evidence of an old healed fracture of the distal fifth metacarpal.  IMPRESSION: Findings concerning for perilunate dislocation. There is scapholunate disassociation with findings concerning for impaction type injury in the distal scaphoid. CT could be helpful for further evaluation of carpal alignment as well as further evaluation of the scaphoid bone in particular.  There is a comminuted fracture of the ulnar styloid with mild displacement of the ulnar styloid with respect to the remainder the ulna. There is evidence of an old healed fracture of the fifth metacarpal.  Electronically Signed: By: Lowella Grip III M.D. On: 11/20/2014 10:58   Ct Lumbar Spine Wo Contrast  11/20/2014   CLINICAL DATA:  Status post 16-20 foot fall today while cutting a tree. Low back pain. Initial encounter.  EXAM: CT LUMBAR SPINE WITHOUT CONTRAST LIMITED  TECHNIQUE: Multidetector CT imaging of the lumbar spine was performed without intravenous contrast  administration. Multiplanar CT image reconstructions were also generated.  COMPARISON:  CT abdomen and pelvis this same day.  FINDINGS: The patient has a mild anterior, superior endplate compression fracture of T12 with an associated subtle sclerotic line at the fracture site. Vertebral body height loss is estimated at up to 15%. There is also mild vertebral body height loss anteriorly at T11 without a sclerotic line identified. This could be due to acute or remote fracture or less likely Schmorl's  nodes. There is no bony retropulsion at either T11 or T12. No involvement of posterior elements is identified. Except as noted, vertebral body height is maintained. Left sacral fracture is noted as seen on CT abdomen and pelvis this same day.  Schmorl's nodes are seen in the inferior endplates of T01, S01 and T12.  IMPRESSION: Mild T12 superior endplate compression fracture without bony retropulsion or extension into the posterior elements.  Mild anterior wedging of T11 may be due to acute or subacute fracture or possibly Schmorl's nodes although this is thought less likely. There is no bony retropulsion or extension into the posterior elements.  Left sacral fracture.   Electronically Signed   By: Inge Rise M.D.   On: 11/20/2014 11:09   Ct Abdomen Pelvis W Contrast  11/20/2014   CLINICAL DATA:  Status post 16-20 foot fall cutting a tree today. Left side low back pain. Pelvic and left groin pain. Initial encounter.  EXAM: CT ABDOMEN AND PELVIS WITH CONTRAST  TECHNIQUE: Multidetector CT imaging of the abdomen and pelvis was performed using the standard protocol following bolus administration of intravenous contrast.  CONTRAST:  100 mL OMNIPAQUE IOHEXOL 300 MG/ML  SOLN  COMPARISON:  None.  FINDINGS: Dependent atelectasis is seen in the lung bases. No pneumothorax is identified. There is no pleural or pericardial effusion.  The gallbladder, spleen, adrenal glands, pancreas and kidneys are unremarkable. Fatty infiltration liver along the falciform ligament is noted. The liver is otherwise unremarkable. No lymphadenopathy is identified.  The patient has a nondisplaced left sacral fracture. Also seen is a nondisplaced fracture of the high left superior pubic ramus. Mildly displaced fracture of the left inferior pubic ramus is noted. Hematoma is seen anterior to the sacrum and in the left side of the pelvis related to the patient's fractures. There is a mild superior endplate compression fracture of T12. Mild  anterior wedging of T11 is also noted but may be related to Schmorl's nodes. No other fracture is identified.  IMPRESSION: Acute left sacral and pubic rami fractures.  Mild superior endplate compression fracture of T12. Please see report of dedicated lumbar spine CT scan this same day.  Mild anterior wedging of T11 could be due to fracture or related to Schmorl's nodes. No other acute finding is identified.   Electronically Signed   By: Inge Rise M.D.   On: 11/20/2014 11:01   Ct Wrist Left Wo Contrast  11/20/2014   CLINICAL DATA:  Fall.  Wrist pain.  Initial encounter.  EXAM: CT OF THE LEFT WRIST WITHOUT CONTRAST  TECHNIQUE: Multidetector CT imaging was performed according to the standard protocol. Multiplanar CT image reconstructions were also generated.  COMPARISON:  11/20/2014 wrist radiographs.  FINDINGS: Perilunate dislocation is present. The radiolunate articulation is preserved. There is dorsal dislocation of the scaphoid, capitate and triquetrum bones. Associated fractures of the dorsal lip of the radius. Comminuted minimally displaced ulnar styloid fracture is also present. Scaphoid waist appears intact. Carpometacarpal joints are normal.  IMPRESSION: Perilunate dislocation. Comminuted ulnar styloid  fracture and small fracture fragments from the dorsal lip of the radius. Significantly, the scaphoid bone appears intact.   Electronically Signed   By: Dereck Ligas M.D.   On: 11/20/2014 12:50   Dg Pelvis Portable  11/20/2014   CLINICAL DATA:  Pain following fall from tree  EXAM: PORTABLE PELVIS 1-2 VIEWS  COMPARISON:  None.  FINDINGS: There are small calcifications inferior to the medial left ischium. Suspect small avulsions in this area. There is a subtle lucency in the medial aspect of the left femoral neck. A subtle fracture in this area cannot be excluded. No other findings suggesting potential fracture. No dislocation. Joint spaces appear intact. There is a bone island in the left femoral  neck region peer  IMPRESSION: Suspect small avulsions arising from the medial inferior left ischium. There is a subtle lucency in the medial aspect of the left femoral neck. Dedicated left hip images are advised to further evaluate this finding. Based on this single image, a nondisplaced fracture in this area cannot be excluded.  Study otherwise unremarkable except for a small bone island in the right femoral neck.   Electronically Signed   By: Lowella Grip III M.D.   On: 11/20/2014 09:58   Dg Chest Portable 1 View  11/20/2014   CLINICAL DATA:  Status post a fall from tree; patient reports shoulder pain.  EXAM: PORTABLE CHEST - 1 VIEW  COMPARISON:  None.  FINDINGS: The lungs are adequately inflated. There is no pneumothorax or pneumomediastinum. The heart and pulmonary vascularity are normal. The mediastinum is normal in width. The observed portions of the bony thorax are unremarkable. The shoulders are not completely included in the field of view.  IMPRESSION: No acute post traumatic injury of the thorax is observed. There is no acute cardiopulmonary disease.   Electronically Signed   By: David  Martinique M.D.   On: 11/20/2014 09:52   Dg Shoulder Left  11/20/2014   CLINICAL DATA:  Status post 16-20 foot fall from a tree today. Left shoulder pain and stiffness. Initial encounter.  EXAM: LEFT SHOULDER - 2+ VIEW  COMPARISON:  None.  FINDINGS: There is no evidence of fracture or dislocation. There is no evidence of arthropathy or other focal bone abnormality. Soft tissues are unremarkable.  IMPRESSION: Negative exam.   Electronically Signed   By: Inge Rise M.D.   On: 11/20/2014 11:10    Positive ROS: All other systems have been reviewed and were otherwise negative with the exception of those mentioned in the HPI and as above.  Labs cbc  Recent Labs  11/20/14 0920 11/20/14 0945  WBC 9.4  --   HGB 16.4 18.0*  HCT 48.1 53.0*  PLT 297  --     Labs inflam No results for input(s): CRP in the  last 72 hours.  Invalid input(s): ESR  Labs coag  Recent Labs  11/20/14 0920  INR 1.09     Recent Labs  11/20/14 0920 11/20/14 0945  NA 137 140  K 3.6 3.6  CL 103 102  CO2 23  --   GLUCOSE 195* 196*  BUN 5* 6  CREATININE 1.33* 1.30*  CALCIUM 8.8*  --     Physical Exam: Filed Vitals:   11/20/14 1245  BP: 139/52  Pulse: 73  Temp:   Resp: 19   General: Alert, no acute distress Cardiovascular: No pedal edema Respiratory: No cyanosis, no use of accessory musculature GI: No organomegaly, abdomen is soft and non-tender Skin: No lesions in  the area of chief complaint other than those listed below in MSK exam.  Neurologic: Sensation intact distally Psychiatric: Patient is competent for consent with normal mood and affect Lymphatic: No axillary or cervical lymphadenopathy  MUSCULOSKELETAL:  At his left upper extremity he has significant swelling in his wrist with decreasing sensation in all nerve distributions. Compartments are soft  At his left lower extremity his compartments are soft he has no tenderness at his knee ankle or feet painless knee and ankle range of motion he can fire his toe flexors and extensors and his ankle as well. He has some tight pain in his left groin with logroll of his hip.  Other extremities are atraumatic with painless ROM and NVI.  Assessment: Left pelvic ring fracture, Left sacrum fracture Left perilunate dislocation  Plan: Obtain inlet/outlet pelvis, likely non-op with [facility] for 6-8wks Hand per dr. Amedeo Plenty Neurosurgery to eval spine.   Renette Butters, MD Cell [phone]   11/20/2014 12:53 PM

## 2014-11-20 NOTE — ED Notes (Signed)
Rn into pt room, pt was sitting up in bed again.  Asked pt why he was sitting up and wife stated "he doesn't want to lay flat anymore and the doctor told him he could sit up".  RN spoke with MD, MD does NOT want pt to be sitting up until seen by surgeon.  Pt lowered down and educated.

## 2014-11-20 NOTE — ED Provider Notes (Signed)
CSN: [id number]     Arrival date & time 11/20/14  0909 History   First MD Initiated Contact with Patient 11/20/14 [phone]     Chief Complaint  Patient presents with  . Fall     (Consider location/radiation/quality/duration/timing/severity/associated sxs/prior Treatment) HPI Comments: Patient is a 34 year old male with no significant past medical history. He presents for evaluation of a fall. Patient was apparently trimming a tree when he fell approximately 20 feet. He states he landed on his left side. He is complaining of pain in his left hip, left wrist, and left shoulder. He denies any chest pain or difficulty breathing. He denies any abdominal pain. He denies any loss of consciousness or headache.  Patient is a 34 y.o. male presenting with fall. The history is provided by the patient.  Fall This is a new problem. The current episode started less than 1 hour ago. The problem occurs constantly. The problem has not changed since onset.Pertinent negatives include no chest pain, no abdominal pain, no headaches and no shortness of breath. Exacerbated by: Movement, bearing weight, and palpation. Nothing relieves the symptoms. He has tried nothing for the symptoms. The treatment provided no relief.    History reviewed. No pertinent past medical history. History reviewed. No pertinent past surgical history. No family history on file. History  Substance Use Topics  . Smoking status: Current Every Day Smoker  . Smokeless tobacco: Not on file  . Alcohol Use: Yes    Review of Systems  Respiratory: Negative for shortness of breath.   Cardiovascular: Negative for chest pain.  Gastrointestinal: Negative for abdominal pain.  Neurological: Negative for headaches.  All other systems reviewed and are negative.     Allergies  Review of patient's allergies indicates no known allergies.  Home Medications   Prior to Admission medications   Not on File   BP 130/70 mmHg  Pulse 58  Temp(Src) 98  F (36.7 C) (Oral)  Resp 24  Ht  (1.905 m)  Wt 230 lb (104.327 kg)  BMI 28.75 kg/m2  SpO2 100% Physical Exam  Constitutional: He is oriented to person, place, and time. He appears well-developed and well-nourished. No distress.  HENT:  Head: Normocephalic and atraumatic.  Neck: Normal range of motion. Neck supple.  Cardiovascular: Normal rate, regular rhythm and normal heart sounds.   No murmur heard. Pulmonary/Chest: Effort normal and breath sounds normal. No respiratory distress. He has no wheezes.  Abdominal: Soft. Bowel sounds are normal. He exhibits no distension. There is no tenderness.  Musculoskeletal: Normal range of motion. He exhibits no edema.  There is severe tenderness over the left anterior iliac bone. Pelvis seems stable to rock. DP and PT pulses are palpable in both extremities. He is able to move his feet and toes. Sensation is intact to the entire foot.  Neurological: He is alert and oriented to person, place, and time.  Skin: Skin is warm and dry. He is not diaphoretic.  Nursing note and vitals reviewed.   ED Course  Procedures (including critical care time) Labs Review Labs Reviewed  CDS SEROLOGY  COMPREHENSIVE METABOLIC PANEL  CBC  ETHANOL  PROTIME-INR  I-STAT CHEM 8, ED  TYPE AND SCREEN    Imaging Review No results found.   EKG Interpretation None      MDM   Final diagnoses:  Fall    Patient presents here after a fall from a tree complaining of pain in the left pelvis and left wrist. Imaging studies reveal fractures of  the superior and inferior ramus and a perilunate fracture of the left wrist. He was also found to have a T12 compression fracture with no retropulsion or canal intrusion.  I've discussed the pelvic fractures with Dr. Eulah PontMurphy from orthopedics. He will evaluate the patient and placed recommendations.  I've also discussed the perilunate dislocation with Dr. [name]. He plans to take the patient to the operating room for  surgical repair of this.  At the request of Dr. Eulah PontMurphy, I have notified neurosurgery of the T12 compression fracture. Dr.Nundkumar recommends no surgical intervention. He does recommend a BSO brace.  Trauma surgery has also been consult it and will evaluate and admit the patient.  CRITICAL CARE Performed by: Geoffery LyonseLo, Gladyse Corvin Total critical care time: 60 minutes Critical care time was exclusive of separately billable procedures and treating other patients. Critical care was necessary to treat or prevent imminent or life-threatening deterioration. Critical care was time spent personally by me on the following activities: development of treatment plan with patient and/or surrogate as well as nursing, discussions with consultants, evaluation of patient's response to treatment, examination of patient, obtaining history from patient or surrogate, ordering and performing treatments and interventions, ordering and review of laboratory studies, ordering and review of radiographic studies, pulse oximetry and re-evaluation of patient's condition.     Geoffery Lyonsouglas Cataleyah Colborn, MD 11/20/14 [phone]

## 2014-11-20 NOTE — ED Notes (Signed)
Trauma at BS 

## 2014-11-20 NOTE — H&P (Signed)
[name] is an 34 y.o. male.   Chief Complaint: Fall from tree HPI: [name] was ~20 feet up a tree working to bring it down when a line caught his saw and pulled him down. He landed on his feet and then fell onto his left side. There was no loss of consciousness. His left wrist had an obvious deformity which he straightened himself at the scene. He was unable to walk due to pelvic pain. He was a level 2 trauma activation.  History reviewed. No pertinent past medical history.  History reviewed. No pertinent past surgical history.  No family history on file. Social History:  reports that he has been smoking.  He does not have any smokeless tobacco history on file. He reports that he drinks alcohol. He reports that he uses illicit drugs (Marijuana).  Allergies: No Known Allergies  Results for orders placed or performed during the hospital encounter of 11/20/14 (from the past 48 hour(s))  Comprehensive metabolic panel     Status: Abnormal   Collection Time: 11/20/14  9:20 AM  Result Value Ref Range   Sodium 137 135 - 145 mmol/L   Potassium 3.6 3.5 - 5.1 mmol/L   Chloride 103 101 - 111 mmol/L   CO2 23 22 - 32 mmol/L   Glucose, Bld 195 (H) 65 - 99 mg/dL   BUN 5 (L) 6 - 20 mg/dL   Creatinine, Ser 1.33 (H) 0.61 - 1.24 mg/dL   Calcium 8.8 (L) 8.9 - 10.3 mg/dL   Total Protein 6.6 6.5 - 8.1 g/dL   Albumin 3.9 3.5 - 5.0 g/dL   AST 50 (H) 15 - 41 U/L   ALT 26 17 - 63 U/L   Alkaline Phosphatase 59 38 - 126 U/L   Total Bilirubin 0.6 0.3 - 1.2 mg/dL   GFR calc non Af Amer >60 >60 mL/min   GFR calc Af Amer >60 >60 mL/min    Comment: (NOTE) The eGFR has been calculated using the CKD EPI equation. This calculation has not been validated in all clinical situations. eGFR's persistently <60 mL/min signify possible Chronic Kidney Disease.    Anion gap 11 5 - 15  CBC     Status: None   Collection Time: 11/20/14  9:20 AM  Result Value Ref Range   WBC 9.4 4.0 - 10.5 K/uL   RBC 5.00 4.22 - 5.81  MIL/uL   Hemoglobin 16.4 13.0 - 17.0 g/dL   HCT 48.1 39.0 - 52.0 %   MCV 96.2 78.0 - 100.0 fL   MCH 32.8 26.0 - 34.0 pg   MCHC 34.1 30.0 - 36.0 g/dL   RDW 12.5 11.5 - 15.5 %   Platelets 297 150 - 400 K/uL  Ethanol     Status: Abnormal   Collection Time: 11/20/14  9:20 AM  Result Value Ref Range   Alcohol, Ethyl (B) 22 (H) <5 mg/dL    Comment:        LOWEST DETECTABLE LIMIT FOR SERUM ALCOHOL IS 5 mg/dL FOR MEDICAL PURPOSES ONLY   Protime-INR     Status: None   Collection Time: 11/20/14  9:20 AM  Result Value Ref Range   Prothrombin Time 14.3 11.6 - 15.2 seconds   INR 1.09 0.00 - 1.49  Type and screen     Status: None   Collection Time: 11/20/14  9:20 AM  Result Value Ref Range   ABO/RH(D) O POS    Antibody Screen NEG    Sample Expiration 11/23/2014   ABO/Rh  Status: None   Collection Time: 11/20/14  9:20 AM  Result Value Ref Range   ABO/RH(D) O POS   I-Stat Chem 8, ED  (not at [facility], [facility])     Status: Abnormal   Collection Time: 11/20/14  9:45 AM  Result Value Ref Range   Sodium 140 135 - 145 mmol/L   Potassium 3.6 3.5 - 5.1 mmol/L   Chloride 102 101 - 111 mmol/L   BUN 6 6 - 20 mg/dL   Creatinine, Ser 1.30 (H) 0.61 - 1.24 mg/dL   Glucose, Bld 196 (H) 65 - 99 mg/dL   Calcium, Ion 1.18 1.12 - 1.23 mmol/L   TCO2 24 0 - 100 mmol/L   Hemoglobin 18.0 (H) 13.0 - 17.0 g/dL   HCT 53.0 (H) 39.0 - 52.0 %   Dg Wrist Complete Left  11/20/2014   ADDENDUM REPORT: 11/20/2014 11:07  ADDENDUM: These results were called by telephone at the time of interpretation on 11/20/2014 at 1059 am to Dr. Veryl Speak , who verbally acknowledged these results.   Electronically Signed   By: Lowella Grip III M.D.   On: 11/20/2014 11:07   11/20/2014   CLINICAL DATA:  Patient fell from tree  EXAM: LEFT WRIST - COMPLETE 3+ VIEW  COMPARISON:  None.  FINDINGS: Frontal, oblique, lateral, and ulnar deviation scaphoid images were obtained. There is a comminuted fracture of the ulnar styloid with mild  displacement of the ulnar styloid from the remainder of the ulna. There is widening between the scaphoid and lunate bone indicative of scapholunate disassociation. On the lateral view, the triquetrum and capitate appear posterior with respect to the lunate, a finding concerning for perilunate dislocation. There is a sclerotic area in the distal scaphoid, concerning for an impaction type injury in this area.  There is evidence of an old healed fracture of the distal fifth metacarpal.  IMPRESSION: Findings concerning for perilunate dislocation. There is scapholunate disassociation with findings concerning for impaction type injury in the distal scaphoid. CT could be helpful for further evaluation of carpal alignment as well as further evaluation of the scaphoid bone in particular.  There is a comminuted fracture of the ulnar styloid with mild displacement of the ulnar styloid with respect to the remainder the ulna. There is evidence of an old healed fracture of the fifth metacarpal.  Electronically Signed: By: Lowella Grip III M.D. On: 11/20/2014 10:58   Ct Lumbar Spine Wo Contrast  11/20/2014   CLINICAL DATA:  Status post 16-20 foot fall today while cutting a tree. Low back pain. Initial encounter.  EXAM: CT LUMBAR SPINE WITHOUT CONTRAST LIMITED  TECHNIQUE: Multidetector CT imaging of the lumbar spine was performed without intravenous contrast administration. Multiplanar CT image reconstructions were also generated.  COMPARISON:  CT abdomen and pelvis this same day.  FINDINGS: The patient has a mild anterior, superior endplate compression fracture of T12 with an associated subtle sclerotic line at the fracture site. Vertebral body height loss is estimated at up to 15%. There is also mild vertebral body height loss anteriorly at T11 without a sclerotic line identified. This could be due to acute or remote fracture or less likely Schmorl's nodes. There is no bony retropulsion at either T11 or T12. No involvement  of posterior elements is identified. Except as noted, vertebral body height is maintained. Left sacral fracture is noted as seen on CT abdomen and pelvis this same day.  Schmorl's nodes are seen in the inferior endplates of F81, W29 and T12.  IMPRESSION: Mild T12 superior endplate compression fracture without bony retropulsion or extension into the posterior elements.  Mild anterior wedging of T11 may be due to acute or subacute fracture or possibly Schmorl's nodes although this is thought less likely. There is no bony retropulsion or extension into the posterior elements.  Left sacral fracture.   Electronically Signed   By: Inge Rise M.D.   On: 11/20/2014 11:09   Ct Abdomen Pelvis W Contrast  11/20/2014   CLINICAL DATA:  Status post 16-20 foot fall cutting a tree today. Left side low back pain. Pelvic and left groin pain. Initial encounter.  EXAM: CT ABDOMEN AND PELVIS WITH CONTRAST  TECHNIQUE: Multidetector CT imaging of the abdomen and pelvis was performed using the standard protocol following bolus administration of intravenous contrast.  CONTRAST:  100 mL OMNIPAQUE IOHEXOL 300 MG/ML  SOLN  COMPARISON:  None.  FINDINGS: Dependent atelectasis is seen in the lung bases. No pneumothorax is identified. There is no pleural or pericardial effusion.  The gallbladder, spleen, adrenal glands, pancreas and kidneys are unremarkable. Fatty infiltration liver along the falciform ligament is noted. The liver is otherwise unremarkable. No lymphadenopathy is identified.  The patient has a nondisplaced left sacral fracture. Also seen is a nondisplaced fracture of the high left superior pubic ramus. Mildly displaced fracture of the left inferior pubic ramus is noted. Hematoma is seen anterior to the sacrum and in the left side of the pelvis related to the patient's fractures. There is a mild superior endplate compression fracture of T12. Mild anterior wedging of T11 is also noted but may be related to Schmorl's nodes. No  other fracture is identified.  IMPRESSION: Acute left sacral and pubic rami fractures.  Mild superior endplate compression fracture of T12. Please see report of dedicated lumbar spine CT scan this same day.  Mild anterior wedging of T11 could be due to fracture or related to Schmorl's nodes. No other acute finding is identified.   Electronically Signed   By: Inge Rise M.D.   On: 11/20/2014 11:01   Ct Wrist Left Wo Contrast  11/20/2014   CLINICAL DATA:  Fall.  Wrist pain.  Initial encounter.  EXAM: CT OF THE LEFT WRIST WITHOUT CONTRAST  TECHNIQUE: Multidetector CT imaging was performed according to the standard protocol. Multiplanar CT image reconstructions were also generated.  COMPARISON:  11/20/2014 wrist radiographs.  FINDINGS: Perilunate dislocation is present. The radiolunate articulation is preserved. There is dorsal dislocation of the scaphoid, capitate and triquetrum bones. Associated fractures of the dorsal lip of the radius. Comminuted minimally displaced ulnar styloid fracture is also present. Scaphoid waist appears intact. Carpometacarpal joints are normal.  IMPRESSION: Perilunate dislocation. Comminuted ulnar styloid fracture and small fracture fragments from the dorsal lip of the radius. Significantly, the scaphoid bone appears intact.   Electronically Signed   By: Dereck Ligas M.D.   On: 11/20/2014 12:50   Dg Pelvis Portable  11/20/2014   CLINICAL DATA:  Pain following fall from tree  EXAM: PORTABLE PELVIS 1-2 VIEWS  COMPARISON:  None.  FINDINGS: There are small calcifications inferior to the medial left ischium. Suspect small avulsions in this area. There is a subtle lucency in the medial aspect of the left femoral neck. A subtle fracture in this area cannot be excluded. No other findings suggesting potential fracture. No dislocation. Joint spaces appear intact. There is a bone island in the left femoral neck region peer  IMPRESSION: Suspect small avulsions arising from the medial  inferior left  ischium. There is a subtle lucency in the medial aspect of the left femoral neck. Dedicated left hip images are advised to further evaluate this finding. Based on this single image, a nondisplaced fracture in this area cannot be excluded.  Study otherwise unremarkable except for a small bone island in the right femoral neck.   Electronically Signed   By: Lowella Grip III M.D.   On: 11/20/2014 09:58   Dg Chest Portable 1 View  11/20/2014   CLINICAL DATA:  Status post a fall from tree; patient reports shoulder pain.  EXAM: PORTABLE CHEST - 1 VIEW  COMPARISON:  None.  FINDINGS: The lungs are adequately inflated. There is no pneumothorax or pneumomediastinum. The heart and pulmonary vascularity are normal. The mediastinum is normal in width. The observed portions of the bony thorax are unremarkable. The shoulders are not completely included in the field of view.  IMPRESSION: No acute post traumatic injury of the thorax is observed. There is no acute cardiopulmonary disease.   Electronically Signed   By: David  Martinique M.D.   On: 11/20/2014 09:52   Dg Shoulder Left  11/20/2014   CLINICAL DATA:  Status post 16-20 foot fall from a tree today. Left shoulder pain and stiffness. Initial encounter.  EXAM: LEFT SHOULDER - 2+ VIEW  COMPARISON:  None.  FINDINGS: There is no evidence of fracture or dislocation. There is no evidence of arthropathy or other focal bone abnormality. Soft tissues are unremarkable.  IMPRESSION: Negative exam.   Electronically Signed   By: Inge Rise M.D.   On: 11/20/2014 11:10    Review of Systems  Constitutional: Negative for weight loss.  HENT: Negative for ear discharge, ear pain, hearing loss and tinnitus.   Eyes: Negative for blurred vision, double vision, photophobia and pain.  Respiratory: Negative for cough, sputum production and shortness of breath.   Cardiovascular: Positive for chest pain.  Gastrointestinal: Negative for nausea, vomiting and abdominal  pain.  Genitourinary: Negative for dysuria, urgency, frequency and flank pain.  Musculoskeletal: Positive for joint pain (Left wrist and left pelvis). Negative for myalgias, back pain, falls and neck pain.  Neurological: Positive for sensory change (Left hand). Negative for dizziness, tingling, focal weakness, loss of consciousness and headaches.  Endo/Heme/Allergies: Does not bruise/bleed easily.  Psychiatric/Behavioral: Negative for depression, memory loss and substance abuse. The patient is not nervous/anxious.     Blood pressure 139/52, pulse 73, temperature 98 F (36.7 C), temperature source Oral, resp. rate 19, height $RemoveBe'6\' 3"'tFqzvIodT$  (1.905 m), weight 104.327 kg (230 lb), SpO2 97 %. Physical Exam  Vitals reviewed. Constitutional: He is oriented to person, place, and time. He appears well-developed and well-nourished. He is cooperative. No distress.  HENT:  Head: Normocephalic and atraumatic. Head is without raccoon's eyes, without Battle's sign, without abrasion, without contusion and without laceration.  Right Ear: Hearing, tympanic membrane, external ear and ear canal normal. No lacerations. No drainage or tenderness. No foreign bodies. Tympanic membrane is not perforated. No hemotympanum.  Left Ear: Hearing, tympanic membrane, external ear and ear canal normal. No lacerations. No drainage or tenderness. No foreign bodies. Tympanic membrane is not perforated. No hemotympanum.  Nose: Nose normal. No nose lacerations, sinus tenderness, nasal deformity or nasal septal hematoma. No epistaxis.  Mouth/Throat: Uvula is midline, oropharynx is clear and moist and mucous membranes are normal. No lacerations. No oropharyngeal exudate.  Eyes: Conjunctivae, EOM and lids are normal. Pupils are equal, round, and reactive to light. Right eye exhibits no discharge. Left eye  exhibits no discharge. No scleral icterus.  Neck: Trachea normal and normal range of motion. Neck supple. No JVD present. No spinous process  tenderness and no muscular tenderness present. Carotid bruit is not present. No tracheal deviation present. No thyromegaly present.  Cardiovascular: Normal rate, regular rhythm, normal heart sounds, intact distal pulses and normal pulses.  Exam reveals no gallop and no friction rub.   No murmur heard. Respiratory: Effort normal and breath sounds normal. No stridor. No respiratory distress. He has no wheezes. He has no rales. He exhibits tenderness (Bilateral lower anterior -- hit branch there on way down). He exhibits no bony tenderness, no laceration and no crepitus.  GI: Soft. Normal appearance and bowel sounds are normal. He exhibits no distension. There is no tenderness. There is no rigidity, no rebound, no guarding and no CVA tenderness.  Genitourinary: Penis normal.  Musculoskeletal: Normal range of motion. He exhibits no edema.       Left wrist: He exhibits tenderness, bony tenderness and swelling.  Lymphadenopathy:    He has no cervical adenopathy.  Neurological: He is alert and oriented to person, place, and time. He has normal strength. No cranial nerve deficit or sensory deficit. GCS eye subscore is 4. GCS verbal subscore is 5. GCS motor subscore is 6.  Skin: Skin is warm, dry and intact. He is not diaphoretic.  Psychiatric: He has a normal mood and affect. His speech is normal and behavior is normal.     Assessment/Plan Fall from tree Left wrist fx -- Dr. Amedeo Plenty to take to OR Multiple left pelvic fxs -- Likely non-operative per Dr. Percell Miller T11 fx -- Dr. Kathyrn Sheriff to assess PSA  Admit to trauma    Lisette Abu, PA-C Pager: [phone] General Trauma PA Pager: [phone] 11/20/2014, 1:47 PM

## 2014-11-20 NOTE — Op Note (Signed)
See [id number]#[id number] [name] Md

## 2014-11-20 NOTE — ED Notes (Signed)
Ortho MD at [facility].

## 2014-11-21 ENCOUNTER — Encounter (HOSPITAL_COMMUNITY): Payer: Self-pay | Admitting: Orthopedic Surgery

## 2014-11-21 NOTE — Op Note (Signed)
NAMJudeth [name]:  [name], [name]                  ACCOUNT NO.:  [id number]  MEDICAL RECORD NO.:  [id number]  LOCATION:  [id number]                        FACILITY:  [facility]  PHYSICIAN:  Dionne AnoWilliam M. Azavier Creson, M.D.DATE OF BIRTH:  July 02, 1980  DATE OF PROCEDURE:  11/20/2014 DATE OF DISCHARGE:  11/20/2014                              OPERATIVE REPORT   PREOPERATIVE DIAGNOSIS:  Closed perilunate fracture dislocation with associated acute carpal tunnel syndrome and ulnar styloid fracture.  POSTOPERATIVE DIAGNOSIS:  Closed perilunate fracture dislocation with associated acute carpal tunnel syndrome and ulnar styloid fracture.  PROCEDURE: 1. Open reduction of perilunate fracture dislocation, left wrist. 2. Scapholunate ligament repair reconstruction, left wrist. 3. Complex capsulorrhaphy and capsular stabilization, left wrist. 4. External fixation with 4 Kirschner wires about the scaphocapitate,     scapholunate, lunotriquetral joints. 5. Extensor pollicis longus decompression and anterior transposition     in the soft tissues. 6. Post interosseous nerve neurectomy, left wrist. 7. Open carpal tunnel release, left wrist, through a separate volar     incision with hematoma decompression. 8. AP, lateral, and oblique x-rays performed, examined, and     interpreted by myself, left upper extremity.  SURGEON:  Dionne AnoWilliam M. [name], M.D.  ASSISTANT:  None.  COMPLICATIONS:  None.  ANESTHESIA:  General with preoperative block.  TOURNIQUET TIME:  Less than 2 hours.  INDICATIONS:  A 34 year old male, who fell 20 feet while trying to work on a tree, it appears and presents with the above-mentioned diagnosis. I have counseled him in regard to the risks and benefits of surgery, and he desires to proceed.  OPERATIVE PROCEDURE:  The patient was seen by myself and Anesthesia, he was carefully handled in the preop holding area.  He has pelvic fractures and T11 compression fracture about the spine.  The patient was given a  block by Dr. [name].  Following this, he was taken to the operative theater and underwent a general anesthetic. He was carefully positioned with logroll technique at all times. Following this, body parts were well padded.  General anesthesia was induced.  The arm was then prepped with Pre-Scrub and Hibiclens scrub, followed by a separate formal Betadine scrub and paint.  Following this, outline marks were made visually.  The patient was then reduced.  I reduced the perilunate dislocation very carefully and then placed him in finger trap traction.  At this time, I made a dorsal utilitarian incision.  The patient had an interval created between the subcu and the fascia.  I then Z-lengthened the fascia for later repair.  I then identified the EPL tendon, it was decompressed and underwent anterior transposition.  Following anterior transposition of the EPL tendon and transfer out of harm's way, I then performed identification of the posterior interosseous nerve.  Posterior interosseous nerve was identified and underwent a crushing and cauterization technique.  Once this was complete, we could see where the dorsal capsule was completely blown off the rim of the radius as is expected in these injuries.  I then made a modified H-shaped incision utilizing the previous rips in the dorsal capsule and exposed the perilunate dislocation.  At this time, identified the scapholunate ligament which was  attached to the scaphoid as is the usual case, and I placed joysticks in the lunate and scaphoid.  Once this was complete, I then reduced the scapholunate ligament and made sure the wrist was reduced to my satisfaction.  At this time, I then set about pinning the joints.  A scaphocapitate and 2 scapholunate pins were placed from radially through a small counterincision utilizing angiocatheter to protect the superficial radial nerve.  An additional lunotriquetral pin was placed ulnarly due to  the lunate having some slight instability in my opinion.  The ulnar styloid fracture was treated closed as it was not unstable.  After these pins were placed, x-rays were taken, all looked quite well, and I was pleased with this.  I then placed a 2.2 metal suture anchor from [employer] in the lunate with pre-drilling technique followed by placement of the threads through the scapholunate ligament which was attached to the scaphoid and had a little bit of bony attachment, which helped in the repair.  This was tied down aggressively and there were no complicating features.  I then oversewed the capsule as best as possible and without difficulty.  The patient tolerated this quite well.  Once this was complete, we then very carefully and cautiously performed irrigation, followed by complex capsulorrhaphy and capsular closure.  Thus, scapholunate interosseous ligament repair direct in nature was performed as well as complex capsulorrhaphy and stabilization of the wrist as well as pinning of the joints as mentioned above including the lunotriquetral, scapholunate, and scaphocapitate.  At this time, I then irrigated copiously and repaired the capsular structures.  These were imbricated as necessary.  Following this, I then repaired the retinaculum in a Z-lengthened fashion.  The EPL was left transferred in the dorsal tissues and the superficial radial nerve was kept intact at all times.  Drain was placed and wound was closed with Prolene after hemostasis was secured.  This was a #7 TLS drain.  The small incision 1 cm in nature over the radial styloid was closed with interrupted suture.  Following this, open carpal tunnel release was performed, an incision was made 1 inch in nature beginning at the distal wrist crease and coursing distally.  Dissection was carried down.  Palmar fascia was incised and the transverse carpal ligament was released in its entirety including portions of the  antebrachial fascia.  There was a large amount of hematoma in the carpal canal and this was decompressed nicely.  A drain was placed in this region and later removed at the conclusion of the case.  Following this, refill was noted to be excellent.  All wounds were closed nicely.  The carpal tunnel release went without difficulty. There was no obvious median nerve injury but there were certainly what appeared to be some contusive findings.  Following this, we then made sure all looked well.  Dressed the patient with Adaptic and Xeroform after the wounds were closed.  I should note, he was placed in a modified sugar-tong splint with thumb spica extension, drain was hooked up to suction.  There were no complicating features.  He will be monitored closely.  He does have a block, and we will wait for this to abate.  I have discussed with the patient the relevant issues, do's and don'ts, etc.  Should any problems occur, I will be immediately available; otherwise, I will look forward to seeing him regularly and follow up while in the hospital and in the outpatient arena.  I have discussed with the  patient these issues, the do's and don'ts, etc.  Should any problems arise,  we will be immediately available.  This is a very complex operation and complex injury.  I have discussed with the patient meaningful expectations.  Typically, 50% arc of motion and a stable wrist are the ultimate goals. Need for salvage and secondary procedures were often times the case with this type of severe injury.  We will need to keep the pins in for at least 8-10 weeks and then remove them in the operative theater.  The patient understands this.  Rehab will be essential as well.  It is a pleasure to see him today, and we will wish him the best.     Dionne Ano. Amanda Pea, M.D.     [facility]  D:  11/20/2014  T:  11/21/2014  Job:  [id number]

## 2014-11-21 NOTE — Progress Notes (Signed)
Informed by pt's primary RN that pt was expressing desire to leave AMA. Spoke with pt regarding situation. Pt stated that he was going home and for staff to "just give me my pain meds and prescriptions, and I'm out of here." Pt also stated he was upset that all we were offering him to eat was crackers and applesauce." This RN explained reasoning for slow introduction to food immediately after surgery. Pt remained adamant that he provide him with medication and prescriptions so that he could leave. This RN explained to pt and visitor at bedside that should the pt chose to leave against medical advice, we would not be ale to provide him either. This RN offered to call pt's surgeon in attempt to aid in de-esclation of situation. Primary RN paged surgeon and explained/updated MD of situation. Refer to primary RN's note for further sequence of events.

## 2014-11-21 NOTE — Progress Notes (Signed)
Patient admitted to [facility] at 2116 on 11/20/2014. RN received report from PACU RN [name]). RN went back into patients room to assess patient and patient tells RN that he is ready to go home. RN asked patient why did he want to go home. Patient stated "I can go home and do whatever it is that yall are gonna do. Just give me some pain meds now and my prescriptions for pain and my antibiotics and Im outta here." RN attempted to persuade the patient to stay by informing the patient that she would attempt to manage his pain with medication and she would try to make him as comfortable as possible. RN informed patient that if he went home that he would be in a lot of pain and may have to come back to the hospital because of it. At this time patients girlfriend at bedside [name]) stated "Look he's ready to go home just call the doctor and get his prescriptions so we can go." RN informed patient that she was going to call the doctor and left the room. RN went to inform the charge RN [name]) of the occurrences that were transpiring. Consulting civil engineer and patients RN went back into the patients room. Charge RN asked the patient was everything ok. Patient stated "No I just need my prescriptions so that I can go home. Im not staying here." Patient then proceeded to ask his girlfriend to let down the bed rail so that he could get ready to go. Patient also told his girlfriend to call someone (whom RN later found out was patients sister) The girlfriend let down the bed rail. Charge RN attempted to deescalate the situation by explaining to the patient that we have everything that he needs here (pain meds, antibiotics, food) if he would just try to stay overnight and see how things go. Patient stated "No" and told us that we needed to call the doctor. Consulting civil engineer and patients RN left the room. RN paged Dr. Amanda [name] and he returned RNs call at 2147. RN informed the doctor of the occurrences. Dr informed RN that he could  not write prescriptions for narcotics for the patient to take home because the patient was leaving against medical advice. Dr asked to speak with the patients girlfriend in hopes of reconciling a situation that took an unexpected detour since leaving PACU. Patients girlfriend stated to the doctor that she couldn't make him stay at the hospital and that while she agreed and hoped that he would stay there was nothing that she could do. During this time the patients self identified sister [name] and her spouse were present). Dr then asked to speak with the patient as well. Patient stated to doctor that he wanted to smoke a cigarette and that he was leaving and that he just wanted his prescriptions. Patient hung up the phone with the doctor and laid the phone on the bed. Dr called RN back and informed RN that he tried his best in attempts to get the patient to stay with no success. Dr informed RN that he will write a prescription for Keflex 500 mg po four times a day and to inform the patient that he needs to take out the test tube set to suction on tomorrow. Dr also informed RN that she should call Trauma MD doctors in order to update them of the situation. After RN got off the phone with Dr. Amanda [name] patients sister came into the hallway where the RN  was sitting and asked the RN if they performed a drug screen on him downstairs. RN informed the patients sister No. RN called Trauma, MD and received a call back from Dr. Luisa [name]. RN informed him of the situation and the MD approved the AMA release with no advisement. RN went back into the patients room to remove two IVs (right wrist and right antecubital) and had the patient sign the AMA form. RN informed patient that he should take out the test tube on tomorrow and that she would send the NT to the emergency room pod E to pick up his prescription for his antibiotic. Patients sister stated "Oh we will just come back to the the ED on tomorrow and make them take  out the drain." RN gave patient his antibiotic prescription and took a wheelchair to the patients room. Patients girlfriend wheeled patient off the unit.

## 2014-11-23 ENCOUNTER — Telehealth (HOSPITAL_COMMUNITY): Payer: Self-pay

## 2014-11-23 NOTE — Telephone Encounter (Signed)
Pt calling with questions regarding wrist care.  Pt not happy with care received!  Pt informed Dr [name] surgeon who operated on wrist transferred to Dr Brunetta GeneraGramigs office to address his questions.

## 2014-12-13 DIAGNOSIS — S62102A Fracture of unspecified carpal bone, left wrist, initial encounter for closed fracture: Secondary | ICD-10-CM | POA: Diagnosis present

## 2014-12-13 DIAGNOSIS — W14XXXA Fall from tree, initial encounter: Secondary | ICD-10-CM | POA: Diagnosis present

## 2014-12-13 DIAGNOSIS — S22089A Unspecified fracture of T11-T12 vertebra, initial encounter for closed fracture: Secondary | ICD-10-CM | POA: Diagnosis present

## 2014-12-13 NOTE — Discharge Summary (Signed)
Physician Discharge Summary  Patient ID: [name] MRN: [id number] DOB/AGE: [birth year] 34 y.o.  Admit date: 11/20/2014 Discharge date: 11/20/2014  Discharge Diagnoses Patient Active Problem List   Diagnosis Date Noted  . Fall from tree 12/13/2014  . Left wrist fracture 12/13/2014  . T11 vertebral fracture 12/13/2014  . Multiple pelvic fractures 11/20/2014    Consultants Dr. Dominica Severin for hand surgery  Dr. Margarita Rana for orthopedic surgery   Procedures 7/5 -- Open reduction of perilunate fracture dislocation, left wrist, scapholunate ligament repair reconstruction, left wrist, complex capsulorrhaphy and capsular stabilization, left wrist, external fixation with 4 Kirschner wires about the scaphocapitate, scapholunate, lunotriquetral joints, extensor pollicis longus decompression and anterior transposition in the soft tissues, post interosseous nerve neurectomy, left wrist, open carpal tunnel release, left wrist, through a separate volar incision with hematoma decompression, AP, lateral, and oblique x-rays performed, examined, and interpreted by myself, left upper extremity by Dr. Amanda Pea   HPI: Serjio was ~20 feet up a tree working to bring it down when a line caught his saw and pulled him down. He landed on his feet and then fell onto his left side. There was no loss of consciousness. His left wrist had an obvious deformity which he straightened himself at the scene. He was unable to walk due to pelvic pain. He was a level 2 trauma activation. His workup included CT scans of the abdomen, pelvis, and wrist as well as extremity x-rays which showed the above-mentioned injuries. Orthopedic and hand surgeries were consulted. Orthopedic surgery recommended weight bearing as tolerated for his pelvic fractures. He was admitted by the trauma service and was taken to the OR by hand surgery for reconstruction of his wrist injury.  Hospital Course: Following surgery the patient did well and left  the hospital AMA as he said he didn't want to stay in the hospital.     Medication List    ASK your doctor about these medications        FISH OIL PO  Take 1 capsule by mouth daily.     multivitamin Tabs tablet  Take 1 tablet by mouth daily.         Signed: Freeman Caldron, PA-C Pager: [phone] General Trauma PA Pager: [phone] 12/13/2014, 1:45 PM

## 2015-01-17 ENCOUNTER — Other Ambulatory Visit: Payer: Self-pay | Admitting: Orthopedic Surgery

## 2015-01-18 ENCOUNTER — Encounter (HOSPITAL_COMMUNITY): Payer: Self-pay | Admitting: *Deleted

## 2015-01-18 NOTE — Progress Notes (Signed)
Pt very rude over the phone about "all the questions" I was asking him for his pre-op call. Told me there was no reason for me to ask these questions. Tried to explain to him that it was for his benefit for me to make sure his medical record was complete. He did not want to answer the question as to whether he still used marijuana, but he said he was an alcoholic, says he drinks "all day". Gave instructions to pt's girlfriend, Gavin Pound, per pt's request.

## 2015-01-19 ENCOUNTER — Encounter (HOSPITAL_COMMUNITY)
Admission: EM | Disposition: A | Discharge status: Home / Self Care | Payer: Self-pay | Source: Ambulatory Visit | Attending: Orthopedic Surgery

## 2015-01-19 ENCOUNTER — Ambulatory Visit (HOSPITAL_COMMUNITY): Payer: Self-pay | Admitting: Anesthesiology

## 2015-01-19 ENCOUNTER — Ambulatory Visit (HOSPITAL_COMMUNITY)
Admission: EM | Admit: 2015-01-19 | Discharge: 2015-01-19 | Disposition: A | Discharge status: Home / Self Care | Payer: Self-pay | Source: Ambulatory Visit | Attending: Orthopedic Surgery | Admitting: Orthopedic Surgery

## 2015-01-19 ENCOUNTER — Encounter (HOSPITAL_COMMUNITY): Payer: Self-pay | Admitting: *Deleted

## 2015-01-19 DIAGNOSIS — S5292XD Unspecified fracture of left forearm, subsequent encounter for closed fracture with routine healing: Secondary | ICD-10-CM | POA: Insufficient documentation

## 2015-01-19 DIAGNOSIS — S52202D Unspecified fracture of shaft of left ulna, subsequent encounter for closed fracture with routine healing: Secondary | ICD-10-CM | POA: Insufficient documentation

## 2015-01-19 DIAGNOSIS — F17210 Nicotine dependence, cigarettes, uncomplicated: Secondary | ICD-10-CM | POA: Insufficient documentation

## 2015-01-19 DIAGNOSIS — X58XXXD Exposure to other specified factors, subsequent encounter: Secondary | ICD-10-CM | POA: Insufficient documentation

## 2015-01-19 DIAGNOSIS — Z9119 Patient's noncompliance with other medical treatment and regimen: Secondary | ICD-10-CM | POA: Insufficient documentation

## 2015-01-19 HISTORY — PX: HARDWARE REMOVAL: SHX979

## 2015-01-19 LAB — CBC
HEMATOCRIT: 47.7 % (ref 39.0–52.0)
HEMOGLOBIN: 16.4 g/dL (ref 13.0–17.0)
MCH: 31.8 pg (ref 26.0–34.0)
MCHC: 34.4 g/dL (ref 30.0–36.0)
MCV: 92.6 fL (ref 78.0–100.0)
Platelets: 300 10*3/uL (ref 150–400)
RBC: 5.15 MIL/uL (ref 4.22–5.81)
RDW: 12.1 % (ref 11.5–15.5)
WBC: 8.4 10*3/uL (ref 4.0–10.5)

## 2015-01-19 LAB — COMPREHENSIVE METABOLIC PANEL
ALBUMIN: 4.2 g/dL (ref 3.5–5.0)
ALT: 12 U/L — ABNORMAL LOW (ref 17–63)
AST: 24 U/L (ref 15–41)
Alkaline Phosphatase: 84 U/L (ref 38–126)
Anion gap: 13 (ref 5–15)
BUN: 6 mg/dL (ref 6–20)
CHLORIDE: 102 mmol/L (ref 101–111)
CO2: 21 mmol/L — ABNORMAL LOW (ref 22–32)
Calcium: 9.1 mg/dL (ref 8.9–10.3)
Creatinine, Ser: 0.99 mg/dL (ref 0.61–1.24)
GFR calc Af Amer: >60 mL/min (ref >60)
Glucose, Bld: 71 mg/dL (ref 65–99)
POTASSIUM: 4.2 mmol/L (ref 3.5–5.1)
Sodium: 136 mmol/L (ref 135–145)
Total Bilirubin: 0.7 mg/dL (ref 0.3–1.2)
Total Protein: 6.9 g/dL (ref 6.5–8.1)

## 2015-01-19 SURGERY — REMOVAL, HARDWARE
Anesthesia: General | Site: Wrist | Laterality: Left

## 2015-01-19 MED ORDER — ROCURONIUM BROMIDE 50 MG/5ML IV SOLN
INTRAVENOUS | Status: AC
  Filled 2015-01-19: qty 1

## 2015-01-19 MED ORDER — SUCCINYLCHOLINE CHLORIDE 20 MG/ML IJ SOLN
INTRAMUSCULAR | Status: AC
  Filled 2015-01-19: qty 1

## 2015-01-19 MED ORDER — HYDROCODONE-ACETAMINOPHEN 5-325 MG PO TABS: 2.0000 | ORAL_TABLET | Freq: Four times a day (QID) | ORAL | Status: AC | PRN

## 2015-01-19 MED ORDER — SODIUM CHLORIDE 0.9 % IJ SOLN
INTRAMUSCULAR | Status: AC
  Filled 2015-01-19: qty 20

## 2015-01-19 MED ORDER — FENTANYL CITRATE (PF) 250 MCG/5ML IJ SOLN
INTRAMUSCULAR | Status: AC
  Filled 2015-01-19: qty 5

## 2015-01-19 MED ORDER — HYDROMORPHONE HCL 1 MG/ML IJ SOLN
INTRAMUSCULAR | Status: AC
  Administered 2015-01-19: 0.5 mg via INTRAVENOUS
  Filled 2015-01-19: qty 1

## 2015-01-19 MED ORDER — CEFAZOLIN SODIUM-DEXTROSE 2-3 GM-% IV SOLR
INTRAVENOUS | Status: AC
  Filled 2015-01-19: qty 50

## 2015-01-19 MED ORDER — CHLORHEXIDINE GLUCONATE 4 % EX LIQD: 60.0000 mL | Freq: Once | CUTANEOUS | Status: DC

## 2015-01-19 MED ORDER — SODIUM CHLORIDE 0.45 % IV SOLN: INTRAVENOUS | Status: DC

## 2015-01-19 MED ORDER — LIDOCAINE HCL (CARDIAC) 20 MG/ML IV SOLN
INTRAVENOUS | Status: AC
  Filled 2015-01-19: qty 10

## 2015-01-19 MED ORDER — HYDROMORPHONE HCL 1 MG/ML IJ SOLN
0.2500 mg | INTRAMUSCULAR | Status: DC | PRN
  Administered 2015-01-19 (×2): 0.5 mg via INTRAVENOUS

## 2015-01-19 MED ORDER — LIDOCAINE HCL (CARDIAC) 20 MG/ML IV SOLN
INTRAVENOUS | Status: DC | PRN
  Administered 2015-01-19: 40 mg via INTRAVENOUS

## 2015-01-19 MED ORDER — CEFAZOLIN SODIUM-DEXTROSE 2-3 GM-% IV SOLR
2.0000 g | INTRAVENOUS | Status: AC
  Administered 2015-01-19: 2 g via INTRAVENOUS

## 2015-01-19 MED ORDER — ONDANSETRON HCL 4 MG/2ML IJ SOLN
INTRAMUSCULAR | Status: AC
  Filled 2015-01-19: qty 4

## 2015-01-19 MED ORDER — FENTANYL CITRATE (PF) 100 MCG/2ML IJ SOLN
INTRAMUSCULAR | Status: DC | PRN
  Administered 2015-01-19 (×3): 50 ug via INTRAVENOUS

## 2015-01-19 MED ORDER — PROPOFOL 10 MG/ML IV BOLUS
INTRAVENOUS | Status: DC | PRN
  Administered 2015-01-19: 190 mg via INTRAVENOUS

## 2015-01-19 MED ORDER — LACTATED RINGERS IV SOLN
INTRAVENOUS | Status: DC
  Administered 2015-01-19 (×2): via INTRAVENOUS

## 2015-01-19 MED ORDER — MIDAZOLAM HCL 2 MG/2ML IJ SOLN
INTRAMUSCULAR | Status: AC
  Filled 2015-01-19: qty 4

## 2015-01-19 MED ORDER — 0.9 % SODIUM CHLORIDE (POUR BTL) OPTIME
TOPICAL | Status: DC | PRN
  Administered 2015-01-19: 1000 mL

## 2015-01-19 MED ORDER — EPHEDRINE SULFATE 50 MG/ML IJ SOLN
INTRAMUSCULAR | Status: AC
Start: 2015-01-19 — End: 2015-01-19
  Filled 2015-01-19: qty 2

## 2015-01-19 MED ORDER — PROMETHAZINE HCL 25 MG/ML IJ SOLN: 6.2500 mg | INTRAMUSCULAR | Status: DC | PRN

## 2015-01-19 MED ORDER — MIDAZOLAM HCL 5 MG/5ML IJ SOLN
INTRAMUSCULAR | Status: DC | PRN
  Administered 2015-01-19: 2 mg via INTRAVENOUS

## 2015-01-19 MED ORDER — OXYCODONE HCL 5 MG PO TABS: 5.0000 mg | ORAL_TABLET | Freq: Once | ORAL | Status: DC | PRN

## 2015-01-19 MED ORDER — OXYCODONE HCL 5 MG/5ML PO SOLN: 5.0000 mg | Freq: Once | ORAL | Status: DC | PRN

## 2015-01-19 MED ORDER — ARTIFICIAL TEARS OP OINT
TOPICAL_OINTMENT | OPHTHALMIC | Status: AC
  Filled 2015-01-19: qty 3.5

## 2015-01-19 MED ORDER — PROPOFOL 10 MG/ML IV BOLUS
INTRAVENOUS | Status: AC
  Filled 2015-01-19: qty 20

## 2015-01-19 MED ORDER — ONDANSETRON HCL 4 MG/2ML IJ SOLN
INTRAMUSCULAR | Status: DC | PRN
  Administered 2015-01-19: 4 mg via INTRAVENOUS

## 2015-01-19 SURGICAL SUPPLY — 53 items
BANDAGE ELASTIC 3 VELCRO ST LF (GAUZE/BANDAGES/DRESSINGS) ×3 IMPLANT
BANDAGE ELASTIC 4 VELCRO ST LF (GAUZE/BANDAGES/DRESSINGS) IMPLANT
BNDG COHESIVE 1X5 TAN STRL LF (GAUZE/BANDAGES/DRESSINGS) IMPLANT
BNDG CONFORM 2 STRL LF (GAUZE/BANDAGES/DRESSINGS) ×3 IMPLANT
BNDG GAUZE ELAST 4 BULKY (GAUZE/BANDAGES/DRESSINGS) ×3 IMPLANT
CAP PIN ORTHO PINK (CAP) IMPLANT
CAP PIN PROTECTOR ORTHO WHT (CAP) IMPLANT
CORDS BIPOLAR (ELECTRODE) ×3 IMPLANT
COVER SURGICAL LIGHT HANDLE (MISCELLANEOUS) ×3 IMPLANT
CUFF TOURNIQUET SINGLE 18IN (TOURNIQUET CUFF) ×3 IMPLANT
CUFF TOURNIQUET SINGLE 24IN (TOURNIQUET CUFF) IMPLANT
DRAPE OEC MINIVIEW 54X84 (DRAPES) IMPLANT
DRAPE SURG 17X23 STRL (DRAPES) ×3 IMPLANT
DRSG ADAPTIC 3X8 NADH LF (GAUZE/BANDAGES/DRESSINGS) ×3 IMPLANT
GAUZE SPONGE 2X2 8PLY STRL LF (GAUZE/BANDAGES/DRESSINGS) IMPLANT
GAUZE SPONGE 4X4 12PLY STRL (GAUZE/BANDAGES/DRESSINGS) ×3 IMPLANT
GAUZE XEROFORM 1X8 LF (GAUZE/BANDAGES/DRESSINGS) ×3 IMPLANT
GLOVE BIOGEL M STRL SZ7.5 (GLOVE) ×3 IMPLANT
GLOVE SS BIOGEL STRL SZ 8 (GLOVE) ×1 IMPLANT
GLOVE SUPERSENSE BIOGEL SZ 8 (GLOVE) ×2
GOWN STRL REUS W/ TWL LRG LVL3 (GOWN DISPOSABLE) ×2 IMPLANT
GOWN STRL REUS W/ TWL XL LVL3 (GOWN DISPOSABLE) ×3 IMPLANT
GOWN STRL REUS W/TWL LRG LVL3 (GOWN DISPOSABLE) ×4
GOWN STRL REUS W/TWL XL LVL3 (GOWN DISPOSABLE) ×6
K-WIRE SMTH SNGL TROCAR .028X4 (WIRE)
KIT BASIN OR (CUSTOM PROCEDURE TRAY) ×3 IMPLANT
KIT ROOM TURNOVER OR (KITS) ×3 IMPLANT
KWIRE SMTH SNGL TROCAR .028X4 (WIRE) IMPLANT
MANIFOLD NEPTUNE II (INSTRUMENTS) ×3 IMPLANT
NEEDLE HYPO 25GX1X1/2 BEV (NEEDLE) IMPLANT
NS IRRIG 1000ML POUR BTL (IV SOLUTION) ×3 IMPLANT
PACK ORTHO EXTREMITY (CUSTOM PROCEDURE TRAY) ×3 IMPLANT
PAD ARMBOARD 7.5X6 YLW CONV (MISCELLANEOUS) ×6 IMPLANT
PAD CAST 4YDX4 CTTN HI CHSV (CAST SUPPLIES) IMPLANT
PADDING CAST COTTON 4X4 STRL (CAST SUPPLIES)
SOLUTION BETADINE 4OZ (MISCELLANEOUS) ×3 IMPLANT
SPLINT FIBERGLASS 3X12 (CAST SUPPLIES) ×3 IMPLANT
SPONGE GAUZE 2X2 STER 10/PKG (GAUZE/BANDAGES/DRESSINGS)
SPONGE SCRUB IODOPHOR (GAUZE/BANDAGES/DRESSINGS) ×3 IMPLANT
STOCKINETTE TUBULAR COTT 4X25 (GAUZE/BANDAGES/DRESSINGS) ×3 IMPLANT
SUCTION FRAZIER TIP 10 FR DISP (SUCTIONS) IMPLANT
SUT CHROMIC 4 0 PS 2 18 (SUTURE) ×3 IMPLANT
SUT MERSILENE 4 0 P 3 (SUTURE) IMPLANT
SUT PROLENE 4 0 PS 2 18 (SUTURE) IMPLANT
SUT VIC AB 2-0 CT1 27 (SUTURE)
SUT VIC AB 2-0 CT1 TAPERPNT 27 (SUTURE) IMPLANT
SYR CONTROL 10ML LL (SYRINGE) IMPLANT
TOWEL OR 17X24 6PK STRL BLUE (TOWEL DISPOSABLE) ×3 IMPLANT
TOWEL OR 17X26 10 PK STRL BLUE (TOWEL DISPOSABLE) ×3 IMPLANT
TUBE CONNECTING 12'X1/4 (SUCTIONS)
TUBE CONNECTING 12X1/4 (SUCTIONS) IMPLANT
UNDERPAD 30X30 INCONTINENT (UNDERPADS AND DIAPERS) ×3 IMPLANT
WATER STERILE IRR 1000ML POUR (IV SOLUTION) ×3 IMPLANT

## 2015-01-19 NOTE — Discharge Instructions (Signed)

## 2015-01-19 NOTE — Anesthesia Preprocedure Evaluation (Signed)
Anesthesia Evaluation  Patient identified by MRN, date of birth, ID band Patient awake    Reviewed: Allergy & Precautions, NPO status , Patient's Chart, lab work & pertinent test results  Airway Mallampati: II  TM Distance: >3 FB Neck ROM: Full    Dental   Pulmonary Current Smoker,  breath sounds clear to auscultation        Cardiovascular negative cardio ROS  Rhythm:Regular Rate:Normal     Neuro/Psych negative neurological ROS     GI/Hepatic negative GI ROS, Neg liver ROS,   Endo/Other  negative endocrine ROS  Renal/GU negative Renal ROS     Musculoskeletal   Abdominal   Peds  Hematology negative hematology ROS (+)   Anesthesia Other Findings   Reproductive/Obstetrics                             Anesthesia Physical Anesthesia Plan  ASA: I  Anesthesia Plan: General   Post-op Pain Management:    Induction: Intravenous  Airway Management Planned: LMA  Additional Equipment:   Intra-op Plan:   Post-operative Plan:   Informed Consent: I have reviewed the patients History and Physical, chart, labs and discussed the procedure including the risks, benefits and alternatives for the proposed anesthesia with the patient or authorized representative who has indicated his/her understanding and acceptance.     Plan Discussed with: CRNA  Anesthesia Plan Comments:         Anesthesia Quick Evaluation

## 2015-01-19 NOTE — Anesthesia Procedure Notes (Signed)
Procedure Name: LMA Insertion Date/Time: 01/19/2015 12:13 PM Performed by: Edmonia Caprio Pre-anesthesia Checklist: Timeout performed, Patient identified, Emergency Drugs available, Suction available and Patient being monitored Patient Re-evaluated:Patient Re-evaluated prior to inductionOxygen Delivery Method: Circle system utilized Preoxygenation: Pre-oxygenation with 100% oxygen Intubation Type: IV induction Ventilation: Mask ventilation without difficulty LMA: LMA inserted LMA Size: 5.0 Tube type: Oral Number of attempts: 1 Placement Confirmation: positive ETCO2 and breath sounds checked- equal and bilateral Tube secured with: Tape

## 2015-01-19 NOTE — Transfer of Care (Signed)
Immediate Anesthesia Transfer of Care Note  Patient: [name]  Procedure(s) Performed: Procedure(s): HARDWARE REMOVAL X2 LEFT WRIST (Left)  Patient Location: PACU  Anesthesia Type:General  Level of Consciousness: awake and alert   Airway & Oxygen Therapy: Patient Spontanous Breathing and Patient connected to nasal cannula oxygen  Post-op Assessment: Report given to RN and Post -op Vital signs reviewed and stable  Post vital signs: Reviewed and stable  Last Vitals:  Filed Vitals:   01/19/15 1300  BP: 147/94  Pulse: 53  Temp: 36.7 C  Resp:     Complications: No apparent anesthesia complications

## 2015-01-19 NOTE — Op Note (Signed)
See dictation#[id number]  Status post hardware removal left wrist as continuation of his trauma care secondary to perilunate fracture dislocation with surgical care  Erie Sica M.D.

## 2015-01-19 NOTE — Anesthesia Postprocedure Evaluation (Signed)
  Anesthesia Post-op Note  Patient: [name]  Procedure(s) Performed: Procedure(s): HARDWARE REMOVAL X2 LEFT WRIST (Left)  Patient Location: PACU  Anesthesia Type:General  Level of Consciousness: awake and alert   Airway and Oxygen Therapy: Patient Spontanous Breathing  Post-op Pain: none  Post-op Assessment: Post-op Vital signs reviewed              Post-op Vital Signs: Reviewed  Last Vitals:  Filed Vitals:   01/19/15 1330  BP: 130/92  Pulse: 67  Temp: 36.5 C  Resp: 14    Complications: No apparent anesthesia complications

## 2015-01-19 NOTE — H&P (Signed)
[name] is an 34 y.o. male.   Chief Complaint: Patient presents for hardware removal left wrist HPI: Patient presents for hardware removal left wrist status post scapholunate interosseous ligament reconstruction stabilization and reconstruction of a perilunate fracture dislocation secondary to traumatic injury.  This patient was seen at [facility] an acutely managed by myself secondary to severe trauma. This is a continuation of his trauma care and he now presents for hardware removal.  The patient has done well which is surprising given the fact that he is been completely noncompliant in terms of what we have asked him to do with immobilization and hospitalization. Nevertheless we are now at the point where we are ready for hardware removal. He consents to the removal process.  He denies other complaints today.  He is alert and oriented and appropriate.  History reviewed. No pertinent past medical history.  Past Surgical History  Procedure Laterality Date  . Orif wrist fracture Left 11/20/2014    Procedure: OPEN REDUCTION INTERNAL FIXATION (ORIF) LEFT WRIST RECONSTRUCTION ;  Surgeon: Roseanne Kaufman, MD;  Location: [place];  Service: Orthopedics;  Laterality: Left;  . Carpal tunnel release Left 11/20/2014    Procedure: CARPAL TUNNEL RELEASE;  Surgeon: Roseanne Kaufman, MD;  Location: [place];  Service: Orthopedics;  Laterality: Left;    History reviewed. No pertinent family history. Social History:  reports that he has been smoking Cigarettes.  He does not have any smokeless tobacco history on file. He reports that he drinks about 25.2 oz of alcohol per week. He reports that he uses illicit drugs (Marijuana).  Allergies: No Known Allergies  Medications Prior to Admission  Medication Sig Dispense Refill  . ibuprofen (ADVIL,MOTRIN) 200 MG tablet Take 600-800 mg by mouth daily as needed (back pain).    . Multiple Vitamin (MULTIVITAMIN WITH MINERALS) TABS tablet Take 1 tablet by mouth  daily. One a Day Men's    . Neomycin-Bacitracin-Polymyxin (NEOSPORIN EX) Apply 1 application topically daily as needed (wound care).    . Omega-3 Fatty Acids (FISH OIL PO) Take 2 capsules by mouth daily.       Results for orders placed or performed during the hospital encounter of 01/19/15 (from the past 48 hour(s))  Comprehensive metabolic panel     Status: Abnormal   Collection Time: 01/19/15 10:14 AM  Result Value Ref Range   Sodium 136 135 - 145 mmol/L   Potassium 4.2 3.5 - 5.1 mmol/L   Chloride 102 101 - 111 mmol/L   CO2 21 (L) 22 - 32 mmol/L   Glucose, Bld 71 65 - 99 mg/dL   BUN 6 6 - 20 mg/dL   Creatinine, Ser 0.99 0.61 - 1.24 mg/dL   Calcium 9.1 8.9 - 10.3 mg/dL   Total Protein 6.9 6.5 - 8.1 g/dL   Albumin 4.2 3.5 - 5.0 g/dL   AST 24 15 - 41 U/L   ALT 12 (L) 17 - 63 U/L   Alkaline Phosphatase 84 38 - 126 U/L   Total Bilirubin 0.7 0.3 - 1.2 mg/dL   GFR calc non Af Amer >60 >60 mL/min   GFR calc Af Amer >60 >60 mL/min    Comment: (NOTE) The eGFR has been calculated using the CKD EPI equation. This calculation has not been validated in all clinical situations. eGFR's persistently <60 mL/min signify possible Chronic Kidney Disease.    Anion gap 13 5 - 15  CBC     Status: None   Collection Time: 01/19/15 10:14 AM  Result Value Ref Range   WBC 8.4 4.0 - 10.5 K/uL   RBC 5.15 4.22 - 5.81 MIL/uL   Hemoglobin 16.4 13.0 - 17.0 g/dL   HCT 47.7 39.0 - 52.0 %   MCV 92.6 78.0 - 100.0 fL   MCH 31.8 26.0 - 34.0 pg   MCHC 34.4 30.0 - 36.0 g/dL   RDW 12.1 11.5 - 15.5 %   Platelets 300 150 - 400 K/uL   No results found.  Review of Systems  Respiratory: Negative.   Genitourinary: Negative.   Neurological: Negative.   Endo/Heme/Allergies: Negative.     Blood pressure 143/83, pulse 72, temperature 98.9 F (37.2 C), temperature source Oral, resp. rate 16, height $RemoveBe'6\' 3"'AWHEYtYPk$  (1.905 m), weight 104.327 kg (230 lb), SpO2 100 %. Physical Exam retained hardware left wrist neurovascularly  intact. He has stiffness as expected. His radiographs have remained stable throughout the course of serial radiographs in my office.  The patient is alert and oriented in no acute distress. The patient complains of pain in the affected upper extremity.  The patient is noted to have a normal HEENT exam. Lung fields show equal chest expansion and no shortness of breath. Abdomen exam is nontender without distention. Lower extremity examination does not show any fracture dislocation or blood clot symptoms. Pelvis is stable and the neck and back are stable and nontender.   Assessment/Plan Plan for hardware removal left wrist as a continuation of his trauma care services for the left wrist through [facility].  I've counseled him in regards to risk and benefits and follow-up activities. We are planning surgery for your upper extremity. The risk and benefits of surgery to include risk of bleeding, infection, anesthesia,  damage to normal structures and failure of the surgery to accomplish its intended goals of relieving symptoms and restoring function have been discussed in detail. With this in mind we plan to proceed. I have specifically discussed with the patient the pre-and postoperative regime and the dos and don'ts and risk and benefits in great detail. Risk and benefits of surgery also include risk of dystrophy(CRPS), chronic nerve pain, failure of the healing process to go onto completion and other inherent risks of surgery The relavent the pathophysiology of the disease/injury process, as well as the alternatives for treatment and postoperative course of action has been discussed in great detail with the patient who desires to proceed.  We will do everything in our power to help you (the patient) restore function to the upper extremity. It is a pleasure to see this patient today.  Flois Mctague III,Tyrea Froberg M 01/19/2015, 11:35 AM

## 2015-01-19 NOTE — Op Note (Signed)
[name], [name] NO.:  [id number]  MEDICAL RECORD NO.:  [id number]  LOCATION:  [facility]                         FACILITY:  [facility]  PHYSICIAN:  Dionne Ano. Adeana Grilliot, M.D.DATE OF BIRTH:  02-Jan-1981  DATE OF PROCEDURE:  01/19/2015 DATE OF DISCHARGE:                              OPERATIVE REPORT   PREOPERATIVE DIAGNOSIS:  Status post perilunate fracture dislocation secondary to severe trauma.  The patient presents for continuation of his trauma care to include hardware removal.  POSTOPERATIVE DIAGNOSIS:  Status post perilunate fracture dislocation secondary to severe trauma.  The patient presents for continuation of his trauma care to include hardware removal.  PROCEDURE: 1. Deep hardware removal from the scapholunate region in the form of a     buried deep wire about the radial aspect of his wrist. 2. Removal of deep hardware, ulna aspect of patient's wrist through a     separate incision.  This was a triquetrolunate wire/pin. 3. AP, lateral and oblique x-rays performed, examined, and interpreted     by myself.  SURGEON:  Dionne Ano. Amanda Pea, M.D.  ASSISTANT:  None.  COMPLICATIONS:  None.  ANESTHESIA:  General.  TOURNIQUET TIME:  Zero.  INDICATIONS:  A 34 year old male presents with the above-mentioned diagnosis.  I have counseled him in regard to risks and benefits of surgery, and he desires to proceed with the above-mentioned operative intervention.  All questions have been encouraged and answered and addressed.  I should note that the patient has had significant noncompliance in his postoperative care.  He has followed up in clinic, but has not been a patient who has worn his bracing cast apparatus.  He has also signed out AMA previously.  He does have hardware intact, and fortunately for this patient, he still maintains stability of his wrist, although I do feel that he has a high propensity toward degenerative change given the issues above  noted.  OPERATION IN DETAIL:  The patient was seen by myself and Anesthesia and underwent preoperative antibiotic administration, was prepped and draped in usual sterile fashion with Betadine scrub and paint.  Following this, underwent a smooth induction of surgical intervention in the form of evaluation under anesthesia.  As mentioned, time-out had been called. He was sterilely prepared and anesthetic was given in the form of a general LMA anesthetic.  The patient had an incision made off the radial aspect of his wrist just distal to the styloid.  Dissection was carried down and very carefully dissected deeply onto the scaphoid region of the snuffbox and then removed Kirschner wire deep in location.  This is deep hardware removal.  I performed a limited tenolysis of the first dorsal compartment tendons and irrigated the area copiously.  I confirmed the removal under x-ray and closed the wound with chromic after irrigation.  Following this, the lunotriquetral region was identified and an ulnar incision was made.  Dissection was carried down and deep hardware was removed from the ulnar aspect of his wrist.  The patient tolerated this well.  Following this, I then took AP, lateral, and oblique x-rays and performed a stress test in the operative arena.  He looked excellent under  stress views.  He had no complicating features.  His suture anchor in the lunate was in stable position and condition.  He has no signs of infection, no signs of instability.  I irrigated and closed the ulnar wound with a chromic suture as well.  I placed Adaptic, Xeroform, and 4 x4's as well as thumb spica splint.  I asked him to keep this on, clean dry and intact, and we will see him back in the office in approximately 2 weeks for suture removal.  Given his noncompliance, I did choose chromic suture as this will dissolve by self if there is poor followup by the patient which would not be unexpected given  his history.  I have counseled in regard to all issues.  He was given Norco for pain. I have discussed with him that he needs to be very careful.  I have also discussed with his family his high propensity toward arthritic change into the future and possible need for four-corner fusion with scaphoid excision.  These notes have been discussed, and all questions have been encouraged and answered.     Dionne Ano. Amanda Pea, M.D.     [employer]  D:  01/19/2015  T:  01/19/2015  Job:  [id number]

## 2015-01-22 ENCOUNTER — Encounter (HOSPITAL_COMMUNITY): Payer: Self-pay | Admitting: Orthopedic Surgery

## 2016-12-30 IMAGING — CT CT WRIST*L* W/O CM
3 of 4 series · 11 of 20 positions shown, 13 images · non-contrast
Comparison: 11/20/2014 wrist radiographs.

CLINICAL DATA: Fall.  Wrist pain.  Initial encounter.

EXAM:
CT OF THE LEFT WRIST WITHOUT CONTRAST
TECHNIQUE: Multidetector CT imaging was performed according to the standard
protocol. Multiplanar CT image reconstructions were also generated.

[Series 4: sfov ext 3.0 u90u · axial · 0.44mm/px · z∈[-307,-238]mm · 5 of 35 slices shown, 7 images]
[im 6/35  soft-tissue]
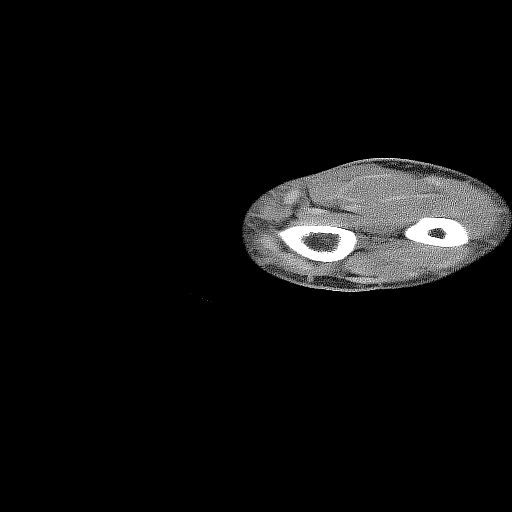
[im 6/35  bone]
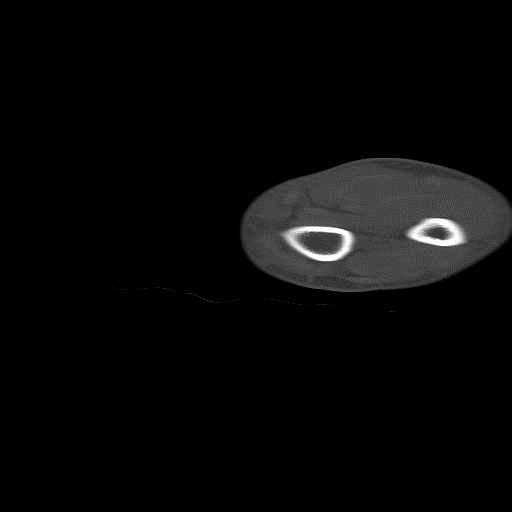
[im 12/35  bone]
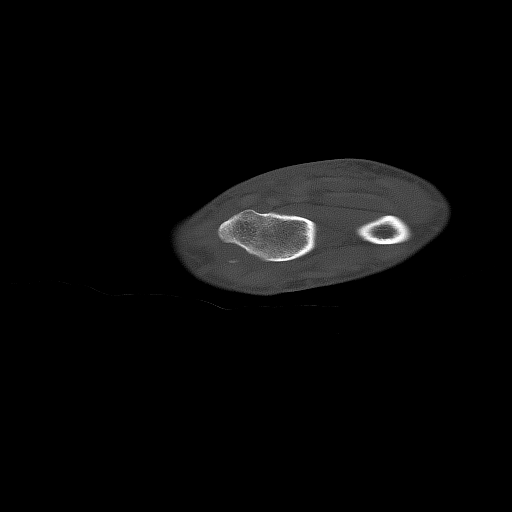
[im 18/35  bone]
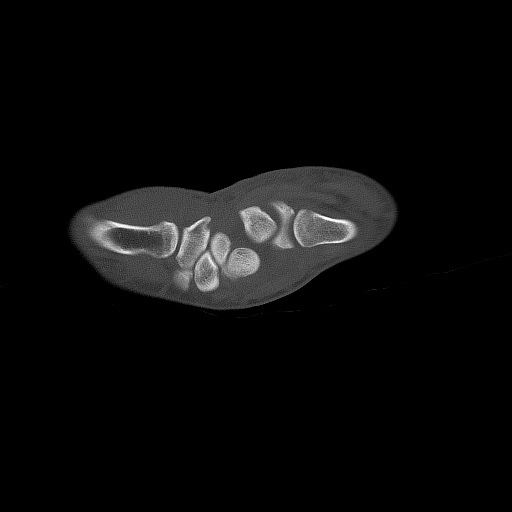
[im 23/35  bone]
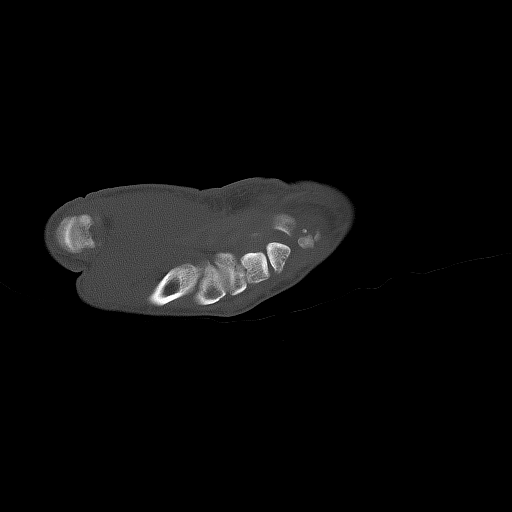
[im 29/35  soft-tissue]
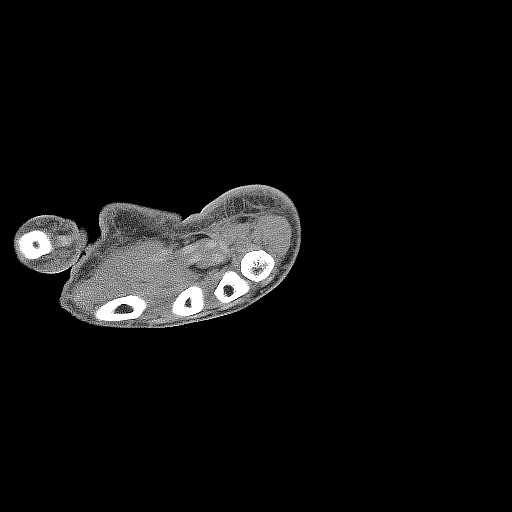
[im 29/35  bone]
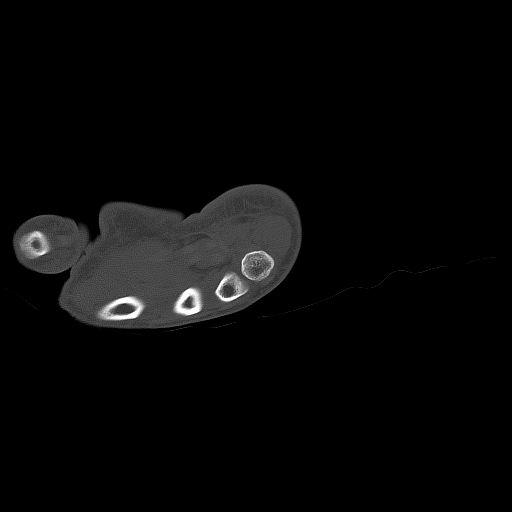

[Series 5: sfov ext 3.0 u30u · axial · 0.44mm/px · z∈[-307,-238]mm · 5 of 35 slices shown]
[im 6/35  bone]
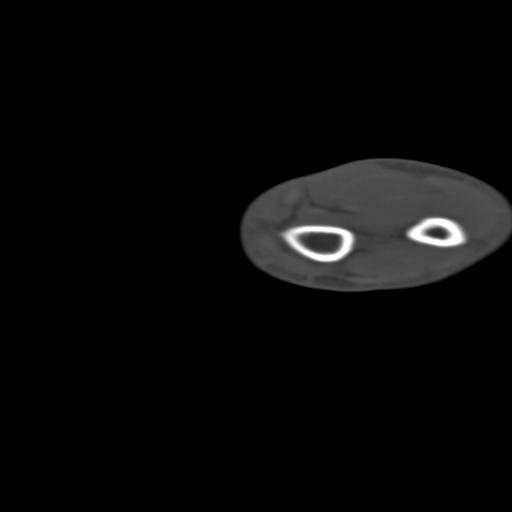
[im 12/35  bone]
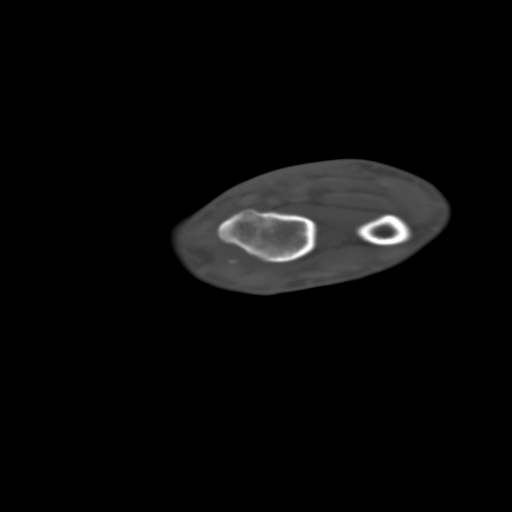
[im 18/35  bone]
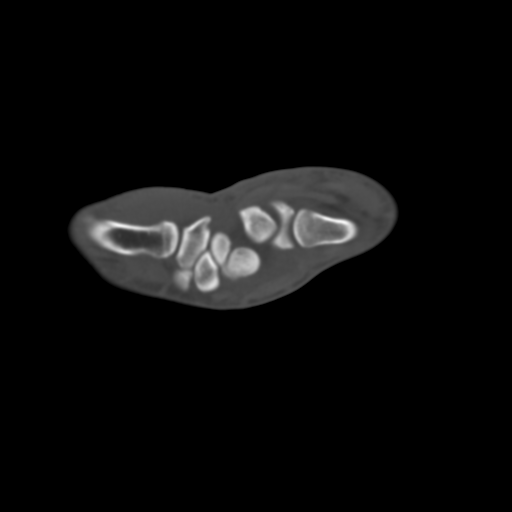
[im 23/35  bone]
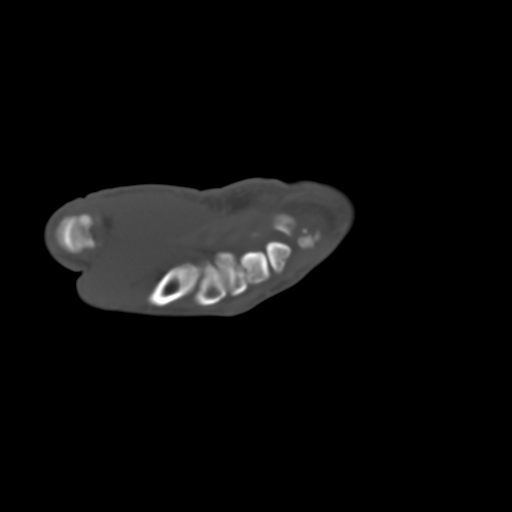
[im 29/35  bone]
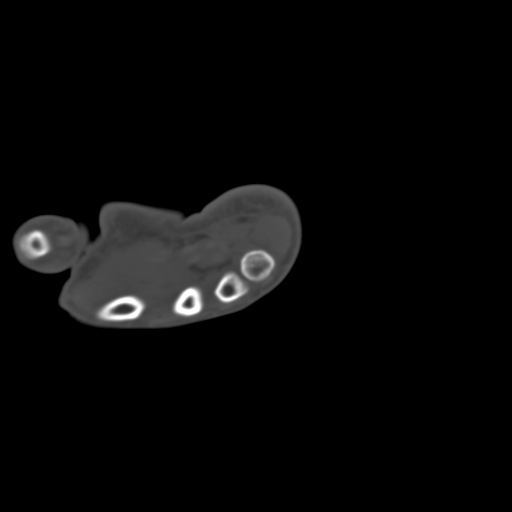

[Series 7: coronal bone · coronal · 0.17mm/px · 1 of 34 slices shown]
[im 17/34  bone]
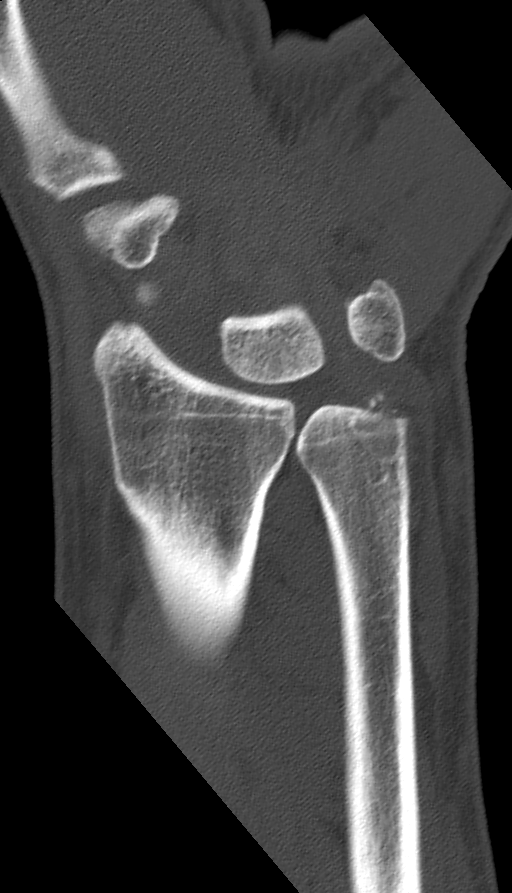

[11 of 20 positions shown; findings below may reference images not displayed]

FINDINGS: Perilunate dislocation is present. The radiolunate articulation is
preserved. There is dorsal dislocation of the scaphoid, capitate and
triquetrum bones. Associated fractures of the dorsal lip of the
radius. Comminuted minimally displaced ulnar styloid fracture is
also present. Scaphoid waist appears intact. Carpometacarpal joints
are normal.
IMPRESSION: Perilunate dislocation. Comminuted ulnar styloid fracture and small
fracture fragments from the dorsal lip of the radius. Significantly,
the scaphoid bone appears intact.

## 2016-12-30 IMAGING — DX DG PELVIS 3+V JUDET
3 series · 3 of 3 positions shown · non-contrast
Comparison: Current abdomen and pelvis CT.

CLINICAL DATA: Followup pelvic fractures noted on the current CT.
Left-sided pelvic pain. Patient fell from a tree today.

EXAM:
JUDET PELVIS - 3+ VIEW

[pelvis ap]
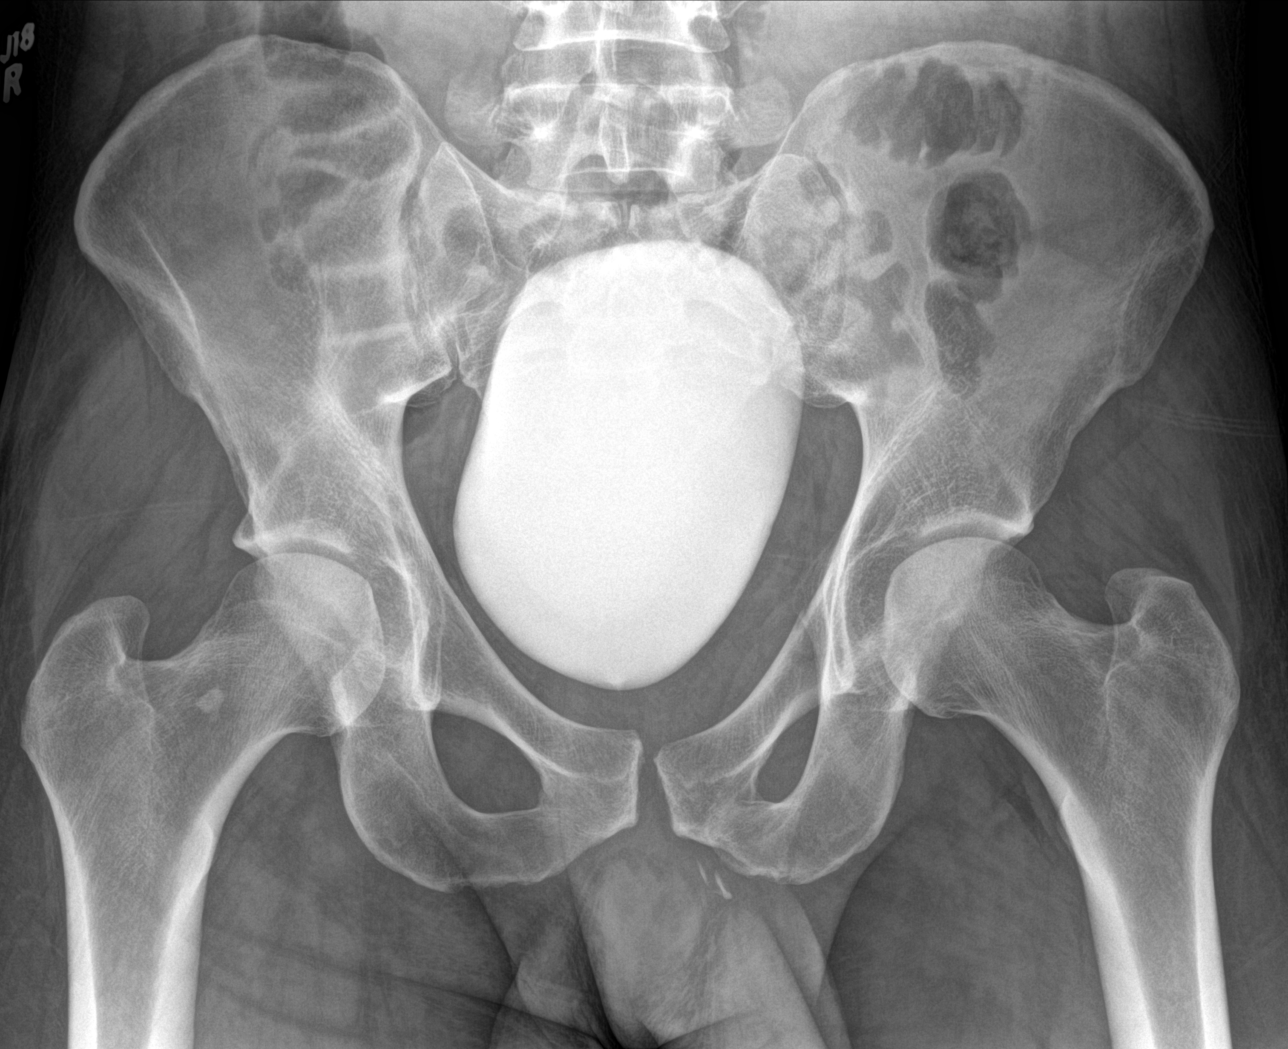

[pelvis obl (1 of 2)]
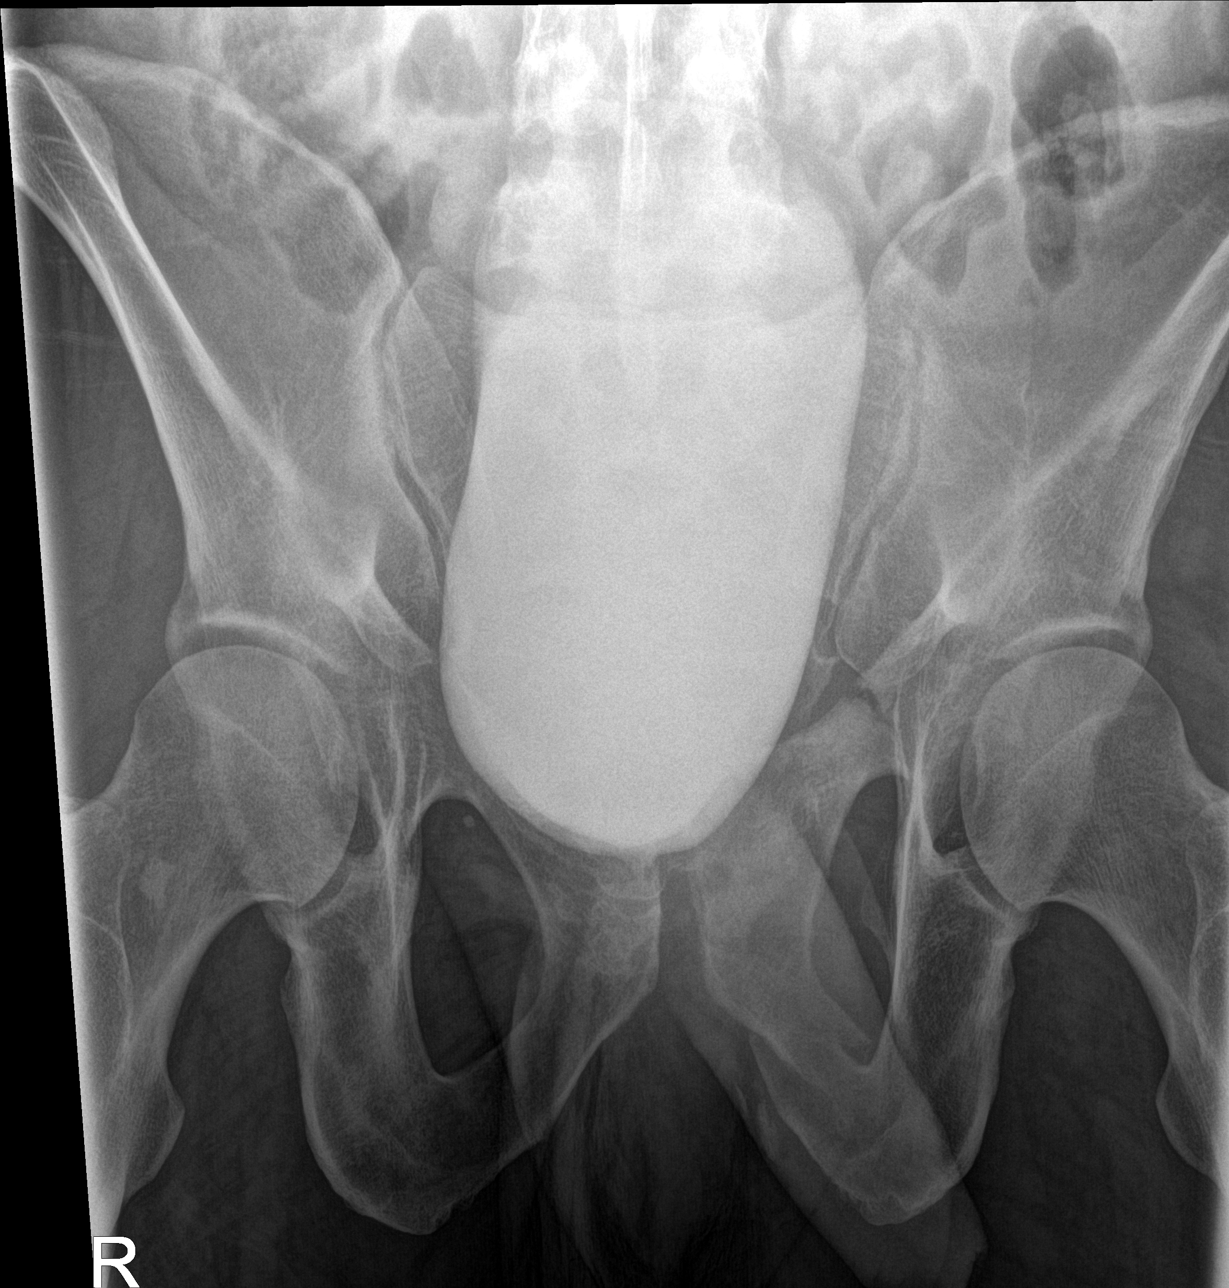

[pelvis obl (2 of 2)]
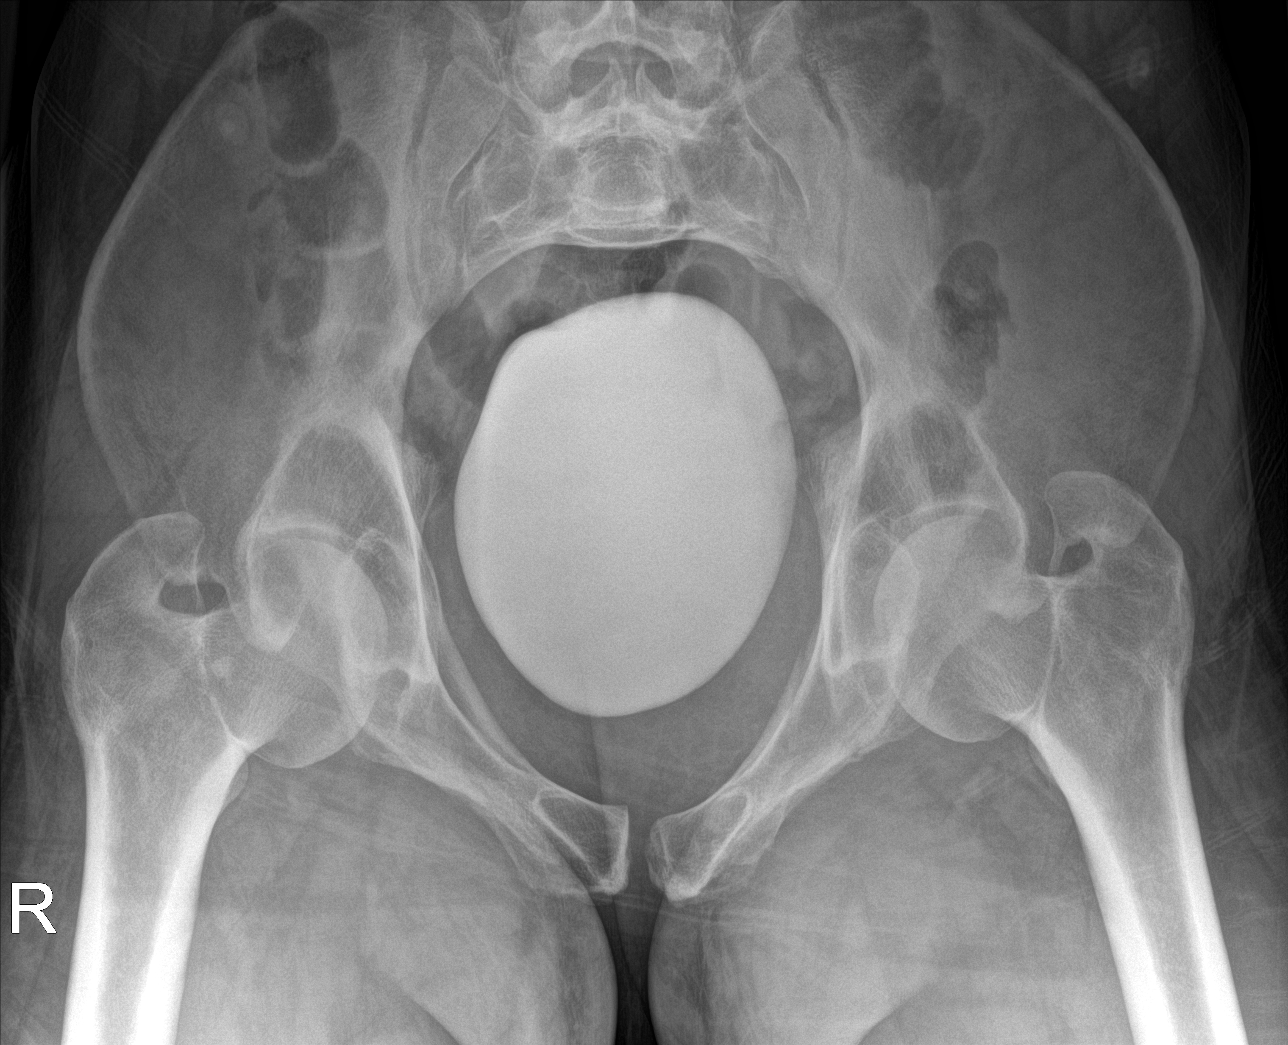

[3 of 3 positions shown; findings below may reference images not displayed]

FINDINGS: There are fractures of the inferior left ischium near the ischial
pelvic junction, and of the superior medial left pubis, without
significant displacement or comminution. The left sacral fracture is
not well defined radiographically.

No other fractures. Hip joints, SI joints and symphysis pubis are
normally spaced and aligned.

Residual contrast is noted in an intact bladder.
IMPRESSION: 1. Nondisplaced fractures of the left pubis in the inferior left
ischium as noted on the current CT. Left sacral fracture not defined
radiographically.
2. No other fractures.  No dislocation.  Intact bladder.

## 2016-12-30 IMAGING — CR DG PORTABLE PELVIS
1 series · 1 of 1 positions shown · non-contrast
Comparison: None.

CLINICAL DATA: Pain following fall from tree

EXAM:
PORTABLE PELVIS 1-2 VIEWS

[ap]
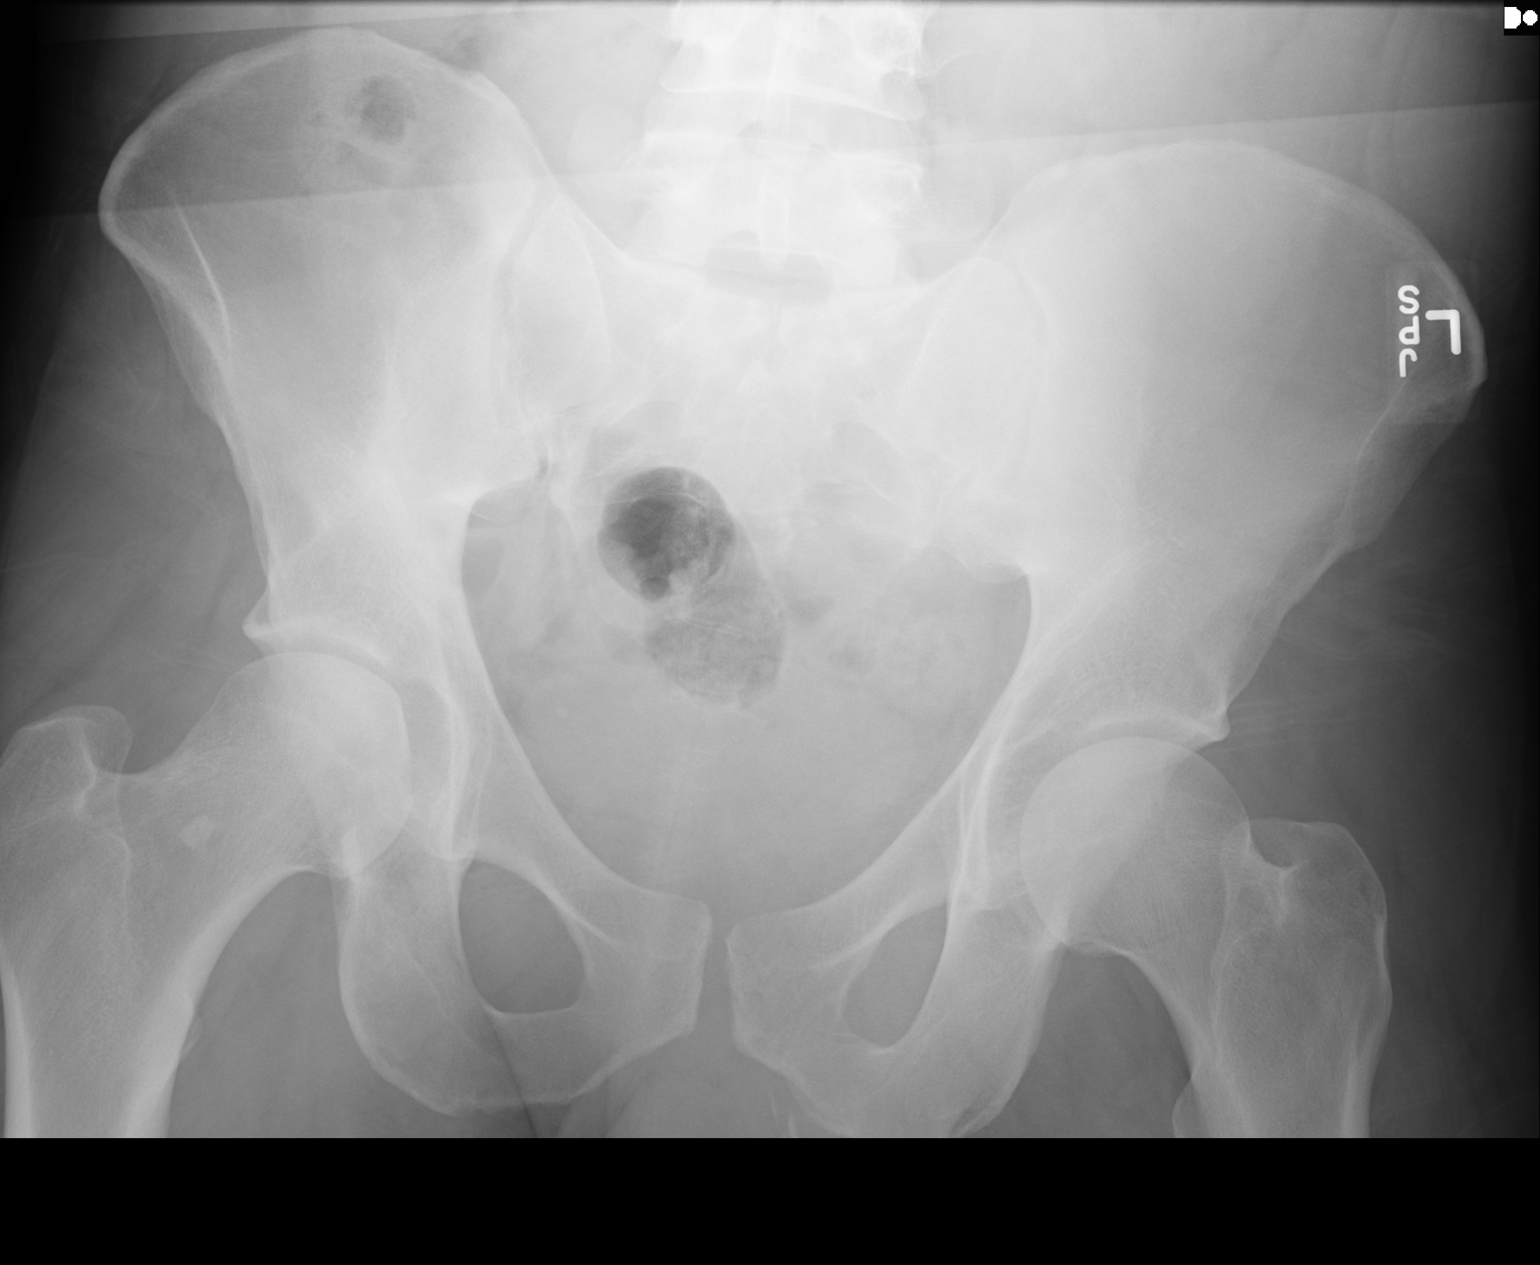

[1 of 1 positions shown; findings below may reference images not displayed]

FINDINGS: There are small calcifications inferior to the medial left ischium.
Suspect small avulsions in this area. There is a subtle lucency in
the medial aspect of the left femoral neck. A subtle fracture in
this area cannot be excluded. No other findings suggesting potential
fracture. No dislocation. Joint spaces appear intact. There is a
bone island in the left femoral neck region peer
IMPRESSION: Suspect small avulsions arising from the medial inferior left
ischium. There is a subtle lucency in the medial aspect of the left
femoral neck. Dedicated left hip images are advised to further
evaluate this finding. Based on this single image, a nondisplaced
fracture in this area cannot be excluded.

Study otherwise unremarkable except for a small bone island in the
right femoral neck.

## 2023-05-19 DEATH — deceased
